# Patient Record
Sex: Female | Born: 1960 | Race: Black or African American | Hispanic: No | Marital: Married | Smoking: Former smoker
Health system: Southern US, Community
[De-identification: ages and names within clinical notes are randomized; demographics above are authoritative.]

## PROBLEM LIST (undated history)

## (undated) DIAGNOSIS — Z889 Allergy status to unspecified drugs, medicaments and biological substances status: Secondary | ICD-10-CM

## (undated) DIAGNOSIS — E119 Type 2 diabetes mellitus without complications: Secondary | ICD-10-CM

## (undated) DIAGNOSIS — I10 Essential (primary) hypertension: Secondary | ICD-10-CM

## (undated) DIAGNOSIS — E7800 Pure hypercholesterolemia, unspecified: Secondary | ICD-10-CM

## (undated) HISTORY — PX: TUBAL LIGATION: SHX77

## (undated) HISTORY — PX: OTHER SURGICAL HISTORY: SHX169

## (undated) HISTORY — PX: DILATION AND CURETTAGE OF UTERUS: SHX78

---

## 2000-07-04 ENCOUNTER — Encounter: Payer: Self-pay | Admitting: Emergency Medicine

## 2000-07-04 ENCOUNTER — Emergency Department (HOSPITAL_COMMUNITY): Admission: EM | Admit: 2000-07-04 | Discharge: 2000-07-04 | Payer: Self-pay | Admitting: *Deleted

## 2000-09-05 ENCOUNTER — Emergency Department (HOSPITAL_COMMUNITY): Admission: EM | Admit: 2000-09-05 | Discharge: 2000-09-05 | Payer: Self-pay | Admitting: Emergency Medicine

## 2000-09-10 ENCOUNTER — Ambulatory Visit (HOSPITAL_COMMUNITY): Admission: RE | Admit: 2000-09-10 | Discharge: 2000-09-10 | Payer: Self-pay | Admitting: Internal Medicine

## 2000-09-11 ENCOUNTER — Encounter: Payer: Self-pay | Admitting: Internal Medicine

## 2000-10-21 ENCOUNTER — Ambulatory Visit (HOSPITAL_COMMUNITY): Admission: RE | Admit: 2000-10-21 | Discharge: 2000-10-21 | Payer: Self-pay | Admitting: Internal Medicine

## 2000-10-21 ENCOUNTER — Encounter: Payer: Self-pay | Admitting: Internal Medicine

## 2000-12-18 ENCOUNTER — Ambulatory Visit (HOSPITAL_COMMUNITY): Admission: RE | Admit: 2000-12-18 | Discharge: 2000-12-18 | Payer: Self-pay | Admitting: Internal Medicine

## 2000-12-18 ENCOUNTER — Encounter: Payer: Self-pay | Admitting: Internal Medicine

## 2001-01-04 ENCOUNTER — Emergency Department (HOSPITAL_COMMUNITY): Admission: EM | Admit: 2001-01-04 | Discharge: 2001-01-04 | Payer: Self-pay | Admitting: Emergency Medicine

## 2001-03-11 ENCOUNTER — Ambulatory Visit (HOSPITAL_COMMUNITY): Admission: RE | Admit: 2001-03-11 | Discharge: 2001-03-11 | Payer: Self-pay | Admitting: Internal Medicine

## 2001-03-12 ENCOUNTER — Encounter: Payer: Self-pay | Admitting: Internal Medicine

## 2001-05-28 ENCOUNTER — Emergency Department (HOSPITAL_COMMUNITY): Admission: EM | Admit: 2001-05-28 | Discharge: 2001-05-28 | Payer: Self-pay | Admitting: *Deleted

## 2001-06-28 ENCOUNTER — Encounter: Payer: Self-pay | Admitting: Otolaryngology

## 2001-06-28 ENCOUNTER — Ambulatory Visit (HOSPITAL_COMMUNITY): Admission: RE | Admit: 2001-06-28 | Discharge: 2001-06-28 | Payer: Self-pay | Admitting: Otolaryngology

## 2002-04-13 ENCOUNTER — Encounter: Payer: Self-pay | Admitting: Internal Medicine

## 2002-04-13 ENCOUNTER — Ambulatory Visit (HOSPITAL_COMMUNITY): Admission: RE | Admit: 2002-04-13 | Discharge: 2002-04-13 | Payer: Self-pay | Admitting: Internal Medicine

## 2002-09-01 ENCOUNTER — Ambulatory Visit (HOSPITAL_COMMUNITY): Admission: RE | Admit: 2002-09-01 | Discharge: 2002-09-01 | Payer: Self-pay | Admitting: Internal Medicine

## 2002-09-01 ENCOUNTER — Encounter: Payer: Self-pay | Admitting: Internal Medicine

## 2002-09-23 ENCOUNTER — Ambulatory Visit (HOSPITAL_COMMUNITY): Admission: RE | Admit: 2002-09-23 | Discharge: 2002-09-23 | Payer: Self-pay | Admitting: Internal Medicine

## 2002-11-25 ENCOUNTER — Emergency Department (HOSPITAL_COMMUNITY): Admission: EM | Admit: 2002-11-25 | Discharge: 2002-11-25 | Payer: Self-pay | Admitting: Emergency Medicine

## 2002-11-28 ENCOUNTER — Encounter: Payer: Self-pay | Admitting: Internal Medicine

## 2002-11-28 ENCOUNTER — Ambulatory Visit (HOSPITAL_COMMUNITY): Admission: RE | Admit: 2002-11-28 | Discharge: 2002-11-28 | Payer: Self-pay | Admitting: Internal Medicine

## 2002-11-29 ENCOUNTER — Observation Stay (HOSPITAL_COMMUNITY): Admission: AD | Admit: 2002-11-29 | Discharge: 2002-11-30 | Payer: Self-pay | Admitting: Internal Medicine

## 2003-04-20 ENCOUNTER — Ambulatory Visit (HOSPITAL_COMMUNITY): Admission: RE | Admit: 2003-04-20 | Discharge: 2003-04-20 | Payer: Self-pay | Admitting: Internal Medicine

## 2003-09-19 ENCOUNTER — Ambulatory Visit (HOSPITAL_COMMUNITY): Admission: RE | Admit: 2003-09-19 | Discharge: 2003-09-19 | Payer: Self-pay | Admitting: Internal Medicine

## 2003-09-26 ENCOUNTER — Ambulatory Visit (HOSPITAL_COMMUNITY): Admission: RE | Admit: 2003-09-26 | Discharge: 2003-09-26 | Payer: Self-pay | Admitting: Internal Medicine

## 2004-02-27 ENCOUNTER — Ambulatory Visit (HOSPITAL_COMMUNITY): Admission: RE | Admit: 2004-02-27 | Discharge: 2004-02-27 | Payer: Self-pay | Admitting: Internal Medicine

## 2004-05-13 ENCOUNTER — Ambulatory Visit (HOSPITAL_COMMUNITY): Admission: RE | Admit: 2004-05-13 | Discharge: 2004-05-13 | Payer: Self-pay | Admitting: Internal Medicine

## 2005-06-09 ENCOUNTER — Ambulatory Visit (HOSPITAL_COMMUNITY): Admission: RE | Admit: 2005-06-09 | Discharge: 2005-06-09 | Payer: Self-pay | Admitting: Internal Medicine

## 2006-06-01 ENCOUNTER — Ambulatory Visit (HOSPITAL_COMMUNITY): Admission: RE | Admit: 2006-06-01 | Discharge: 2006-06-01 | Payer: Self-pay | Admitting: Internal Medicine

## 2007-06-07 ENCOUNTER — Encounter (INDEPENDENT_AMBULATORY_CARE_PROVIDER_SITE_OTHER): Payer: Self-pay | Admitting: Internal Medicine

## 2007-06-07 ENCOUNTER — Ambulatory Visit: Payer: Self-pay | Admitting: Cardiology

## 2007-06-07 ENCOUNTER — Ambulatory Visit (HOSPITAL_COMMUNITY): Admission: RE | Admit: 2007-06-07 | Discharge: 2007-06-07 | Payer: Self-pay | Admitting: Internal Medicine

## 2007-06-08 ENCOUNTER — Ambulatory Visit (HOSPITAL_COMMUNITY): Admission: RE | Admit: 2007-06-08 | Discharge: 2007-06-08 | Payer: Self-pay | Admitting: Internal Medicine

## 2007-07-05 ENCOUNTER — Ambulatory Visit (HOSPITAL_COMMUNITY): Admission: RE | Admit: 2007-07-05 | Discharge: 2007-07-05 | Payer: Self-pay | Admitting: Internal Medicine

## 2008-06-01 ENCOUNTER — Other Ambulatory Visit: Admission: RE | Admit: 2008-06-01 | Discharge: 2008-06-01 | Payer: Self-pay | Admitting: Obstetrics and Gynecology

## 2008-07-06 ENCOUNTER — Ambulatory Visit (HOSPITAL_COMMUNITY): Admission: RE | Admit: 2008-07-06 | Discharge: 2008-07-06 | Payer: Self-pay | Admitting: Obstetrics & Gynecology

## 2008-07-18 ENCOUNTER — Ambulatory Visit (HOSPITAL_COMMUNITY): Admission: RE | Admit: 2008-07-18 | Discharge: 2008-07-18 | Payer: Self-pay | Admitting: Internal Medicine

## 2009-06-25 ENCOUNTER — Other Ambulatory Visit: Admission: RE | Admit: 2009-06-25 | Discharge: 2009-06-25 | Payer: Self-pay | Admitting: Obstetrics & Gynecology

## 2009-07-19 ENCOUNTER — Ambulatory Visit (HOSPITAL_COMMUNITY): Admission: RE | Admit: 2009-07-19 | Discharge: 2009-07-19 | Payer: Self-pay | Admitting: Obstetrics & Gynecology

## 2010-03-18 ENCOUNTER — Encounter: Payer: Self-pay | Admitting: Internal Medicine

## 2010-06-04 LAB — COMPREHENSIVE METABOLIC PANEL
ALT: 16 U/L (ref 0–35)
AST: 17 U/L (ref 0–37)
Alkaline Phosphatase: 43 U/L (ref 39–117)
CO2: 24 mEq/L (ref 19–32)
Calcium: 9.4 mg/dL (ref 8.4–10.5)
GFR calc Af Amer: >60 mL/min (ref >60)
Glucose, Bld: 99 mg/dL (ref 70–99)
Potassium: 4.2 mEq/L (ref 3.5–5.1)
Sodium: 138 mEq/L (ref 135–145)
Total Protein: 6.9 g/dL (ref 6.0–8.3)

## 2010-06-04 LAB — URINALYSIS, MICROSCOPIC ONLY
Bilirubin Urine: NEGATIVE
Hgb urine dipstick: NEGATIVE
Ketones, ur: NEGATIVE mg/dL
Specific Gravity, Urine: >1.03 — ABNORMAL HIGH (ref 1.005–1.030)
pH: 6 (ref 5.0–8.0)

## 2010-06-04 LAB — CBC
Hemoglobin: 13 g/dL (ref 12.0–15.0)
MCHC: 34.5 g/dL (ref 30.0–36.0)
RBC: 4.15 MIL/uL (ref 3.87–5.11)

## 2010-06-13 ENCOUNTER — Other Ambulatory Visit: Payer: Self-pay | Admitting: Obstetrics & Gynecology

## 2010-06-13 DIAGNOSIS — Z139 Encounter for screening, unspecified: Secondary | ICD-10-CM

## 2010-07-04 ENCOUNTER — Other Ambulatory Visit (HOSPITAL_COMMUNITY)
Admission: RE | Admit: 2010-07-04 | Discharge: 2010-07-04 | Disposition: A | Discharge status: Home / Self Care | Payer: BC Managed Care – PPO | Source: Ambulatory Visit | Attending: Obstetrics & Gynecology | Admitting: Obstetrics & Gynecology

## 2010-07-04 DIAGNOSIS — Z01419 Encounter for gynecological examination (general) (routine) without abnormal findings: Secondary | ICD-10-CM | POA: Insufficient documentation

## 2010-07-09 NOTE — Op Note (Signed)
[name], [name]                  ACCOUNT NO.:  [id number]   MEDICAL RECORD NO.:  [id number]          PATIENT TYPE:  AMB   LOCATION:  DAY                           FACILITY:  [facility]   PHYSICIAN:  Lazaro Arms, M.D.   DATE OF BIRTH:  Nov 04, 1960   DATE OF PROCEDURE:  07/06/2008  DATE OF DISCHARGE:                               OPERATIVE REPORT   PREOPERATIVE DIAGNOSES:  1. Menometrorrhagia.  2. Dysmenorrhea.   POSTOPERATIVE DIAGNOSES:  1. Menometrorrhagia.  2. Dysmenorrhea.   PROCEDURE:  Diagnostic hysteroscopy with endometrial ablation.   SURGEON:  Lazaro Arms, M.D.   ANESTHESIA:  Laryngeal mask airway.   FINDINGS:  The patient had normal endometrium, no fibroids, no polyps,  no abnormalities.   DESCRIPTION OF OPERATION:  The patient was taken to the operating room,  placed in supine position, where she underwent laryngeal mask airway.  Placed in dorsal supine position, prepped and draped in the usual  sterile fashion.  Cervix was grasped, dilated serially to allow passage  of the hysteroscope.  Diagnostic hysteroscopy was performed and found to  be normal.  There were no abnormalities.  She had very thin endometrium  and difficult angle of her cervix, so I skipped the curettage and went  and did the endometrial ablation using ThermaChoice III balloon, 20 mL  D5W was required to maintain a pressure between 190-200 mmHg.  She  tolerated the procedure well.  Total therapy time was 2 minutes 25  seconds.  All the fluid was returned at the end of the procedure.  She  was awakened from anesthesia, taken to recovery room in good stable  condition.  All counts were correct x3.  She received Ancef and Toradol  prophylactically.      Lazaro Arms, M.D.  Electronically Signed     LHE/MEDQ  D:  07/06/2008  T:  07/07/2008  Job:  [id number]

## 2010-07-12 NOTE — H&P (Signed)
   NAME:  Cassandra Ballard, Cassandra Ballard                         ACCOUNT NO.:  [id number]   MEDICAL RECORD NO.:  [id number]                   PATIENT TYPE:  AMB   LOCATION:  DAY                                  FACILITY:  [facility]   PHYSICIAN:  Bernerd Limbo. Leona Carry, M.D.             DATE OF BIRTH:  05/25/1960   DATE OF ADMISSION:  09/23/2002  DATE OF DISCHARGE:                                HISTORY & PHYSICAL   REASON FOR ADMISSION:  This 50 year old black female was admitted to this  hospital with irregular vaginal bleeding.   HISTORY OF PRESENT ILLNESS:  She is a gravida 2, para 2, AB 0.  She has had  irregular bleeding since early in 2000.  She has been unresponsive to  conservative treatment reveals an increase in the amount of endometrial  tissue.  This was confirmed by pelvic ultrasound.  The recent pregnancy test  was negative.   PAST HISTORY:  As mentioned, gravida 2, para 2, AB 0.  She has had upper  endoscopy and lower endoscopy by Dr. Kendell Bane.  She has been treated in the  past for GERD.   PHYSICAL EXAMINATION:  GENERAL APPEARANCE:  A healthy, 50 year old, black  female in no acute distress.  VITAL SIGNS:  Blood pressure 110/74, pulse 80, respirations 20.  HEENT:  Normal.  No jaundice.  NECK:  Supple.  Thyroid not enlarged.  CARDIOVASCULAR:  Regular sinus rhythm with no thrills or murmurs.  RESPIRATORY:  Chest clear to auscultation and percussion.  BREASTS:  Moderate size.  Nipples symmetrical.  No masses.  Examination of  both of the axillae is normal.  ABDOMEN:  Soft.  No areas of tenderness.  No visceromegaly.  No operative  scars.  LIMBS:  Negative.  BACK:  Negative.  PELVIC:  There is a marital introitus.  No pathology of Bartholin's or  Skene's glands.  Cervix in mid position and it is clean.  There is some old  blood in the vault.  The uterus seemed to be a little bit larger than  normal.  Examination of both adnexa was normal.  A recent Pap smear was  within normal limits.   ADMISSION DIAGNOSIS:  Dysfunctional uterine bleeding.   DISPOSITION:  The patient is admitted for a D&C.  The surgery, risks,  complications, and consequences have been discussed with the patient and she  agrees.  She has been scheduled for the [facility] on September 23, 2002.                                               Bernerd Limbo. Leona Carry, M.D.    NMD/MEDQ  D:  09/22/2002  T:  09/22/2002  Job:  [id number]

## 2010-07-12 NOTE — Op Note (Signed)
   NAME:  Cassandra Ballard, Cassandra Ballard                         ACCOUNT NO.:  [id number]   MEDICAL RECORD NO.:  [id number]                   PATIENT TYPE:  AMB   LOCATION:  DAY                                  FACILITY:  [facility]   PHYSICIAN:  Bernerd Limbo. Leona Carry, M.D.             DATE OF BIRTH:  [birth year]   DATE OF PROCEDURE:  09/23/2002  DATE OF DISCHARGE:                                 OPERATIVE REPORT   PREOPERATIVE DIAGNOSIS:  Endometrial hyperplasia.   POSTOPERATIVE DIAGNOSIS:  Endometrial hyperplasia.   PROCEDURE:  D&C.   COMPLICATIONS:  None.   SURGEON:  Bernerd Limbo. Destefano, M.D.   DESCRIPTION OF PROCEDURE:  Under adequate general anesthesia, the patient  was prepped and draped in the usual manner.   Bimanual examination revealed the uterus to be slightly larger than normal.  Examination of both adnexa was normal.  A metal speculum was inserted into  the vagina.  The cervix appeared to be clean, but there was old blood oozing  from it.  The uterine sound was passed to a depth of about 6 inches.  It  seemed to be a little bit larger than I could tell with bimanual  examination.  The cervical canal was then dilated with a Goodell dilator.  Utilizing several different sizes of curettes, though curetting of the  uterine cavity was carried out, obtaining a large amount of endometrial  tissue.  I really did not see any polyps.  At the completion of the  procedure, all bleeding was under control.  I left a gauze sponge in the  vault to be removed in the recovery room.  Blood loss was minimal.   The patient tolerated the procedure nicely and left the room in good  condition.                                               Bernerd Limbo. Leona Carry, M.D.    NMD/MEDQ  D:  09/23/2002  T:  09/23/2002  Job:  [id number]

## 2010-07-12 NOTE — H&P (Signed)
NAME:  Cassandra Ballard, Cassandra Ballard                         ACCOUNT NO.:  [id number]   MEDICAL RECORD NO.:  [id number]                   PATIENT TYPE:  INP   LOCATION:  A321                                 FACILITY:  [facility]   PHYSICIAN:  Tesfaye D. Felecia Shelling, M.D.              DATE OF BIRTH:  1960-11-21   DATE OF ADMISSION:  11/29/2002  DATE OF DISCHARGE:                                HISTORY & PHYSICAL   CHIEF COMPLAINT:  Abnormal lab findings.   HISTORY OF PRESENT ILLNESS:  This is a 50 year old, African-American female  with history of anxiety disorder and gastroesophageal reflux disease who was  found to have a very low hemoglobin and hematocrit.  She was seen in the  office about two days back with multiple complaints including weakness and  dizziness.  Her blood tests showed a hemoglobin of 7 and hematocrit of 22.  The patient had history of prolonged vaginal bleeding for about one month  for which she was seen by gynecologist and underwent dilatation and  curettage.  At this time, her vaginal bleeding has stopped and GYN symptoms  have subsided.  She had no rectal bleed, hematemesis or melena.  The patient  has no history of hematologic problems.  Her previous lab tests show a  normal hemoglobin and hematocrit.  The patient was reevaluated in the office  and rectal examination of her stool was guaiac negative.  She was admitted  for blood transfusion and further evaluation.   REVIEW OF SYMPTOMS:  CONSTITUTIONAL:  The patient complains of generalized  weakness, dizziness and pain in her right hip.  CARDIOPULMONARY:  No cough,  fever, chest pain or shortness of breath.  GASTROINTESTINAL:  The patient  has some epigastric discomfort.  No history of nausea, vomiting, melena or  hematemesis.  GENITOURINARY:  No dysuria, urgency or frequency with  urination.   PAST MEDICAL HISTORY:  1. Gastroesophageal reflux disease.  2. History of anxiety disorder.  3. Uterine bleeding.   SOCIAL HISTORY:   The patient is employed full time.  She stopped smoking  about two years ago.  No history of alcohol or substance abuse.   PHYSICAL EXAMINATION:  GENERAL:  The patient is alert, awake and sick  looking.  VITAL SIGNS:  Blood pressure 170/80, pulse 88, respirations 16, temperature  98 degrees Fahrenheit.  HEENT:  Pupils equal round and reactive to light.  NECK:  Supple.  CHEST:  Clear with good air entry.  CARDIAC:  S1, S2 heart sounds heard.  No murmur or gallop.  ABDOMEN:  Soft, normoactive bowel sounds.  No mass or organomegaly.  EXTREMITIES:  No leg edema.   ASSESSMENT:  1. Severe microcytic anemia probably secondary to chronic blood loss due to     dysfunctional uterine bleeding.  2. History of gastroesophageal reflux disease.  3. Anxiety disorder.   PLAN:  1. Will type, cross match and transfuse three units of  packed red blood     cells.  2. Will do anemia profile.  3. On discharge, will start the patient on iron therapy and continue to     monitor her white count.     ___________________________________________                                         Eustaquio Maize. Felecia Shelling, M.D.   TDF/MEDQ  D:  11/30/2002  T:  11/30/2002  Job:  [id number]

## 2010-07-23 ENCOUNTER — Ambulatory Visit (HOSPITAL_COMMUNITY): Payer: BC Managed Care – PPO

## 2010-07-25 ENCOUNTER — Ambulatory Visit (HOSPITAL_COMMUNITY)
Admission: RE | Admit: 2010-07-25 | Discharge: 2010-07-25 | Disposition: A | Discharge status: Home / Self Care | Payer: BC Managed Care – PPO | Source: Ambulatory Visit | Attending: Obstetrics & Gynecology | Admitting: Obstetrics & Gynecology

## 2010-07-25 DIAGNOSIS — Z139 Encounter for screening, unspecified: Secondary | ICD-10-CM

## 2010-07-25 DIAGNOSIS — Z1231 Encounter for screening mammogram for malignant neoplasm of breast: Secondary | ICD-10-CM | POA: Insufficient documentation

## 2010-08-01 ENCOUNTER — Other Ambulatory Visit: Payer: Self-pay | Admitting: Obstetrics & Gynecology

## 2010-08-01 DIAGNOSIS — R928 Other abnormal and inconclusive findings on diagnostic imaging of breast: Secondary | ICD-10-CM

## 2010-08-14 ENCOUNTER — Ambulatory Visit (HOSPITAL_COMMUNITY)
Admission: RE | Admit: 2010-08-14 | Discharge: 2010-08-14 | Disposition: A | Discharge status: Home / Self Care | Payer: BC Managed Care – PPO | Source: Ambulatory Visit | Attending: Obstetrics & Gynecology | Admitting: Obstetrics & Gynecology

## 2010-08-14 ENCOUNTER — Other Ambulatory Visit: Payer: Self-pay | Admitting: Obstetrics & Gynecology

## 2010-08-14 ENCOUNTER — Other Ambulatory Visit (HOSPITAL_COMMUNITY): Payer: Self-pay | Admitting: Obstetrics & Gynecology

## 2010-08-14 DIAGNOSIS — R928 Other abnormal and inconclusive findings on diagnostic imaging of breast: Secondary | ICD-10-CM

## 2010-11-19 LAB — BLOOD GAS, ARTERIAL
Bicarbonate: 23.6
FIO2: 0.21
TCO2: 21.1
pCO2 arterial: 37.4
pH, Arterial: 7.415 — ABNORMAL HIGH
pO2, Arterial: 88.1

## 2011-01-09 ENCOUNTER — Other Ambulatory Visit: Payer: Self-pay

## 2011-01-09 ENCOUNTER — Telehealth: Payer: Self-pay

## 2011-01-09 DIAGNOSIS — Z139 Encounter for screening, unspecified: Secondary | ICD-10-CM

## 2011-01-09 NOTE — Telephone Encounter (Signed)
Gastroenterology Pre-Procedure Form     Request Date: 01/15/2011         Requesting Physician: Dr. Felecia Shelling     PATIENT INFORMATION:  Cassandra Ballard is a 50 y.o., female (DOB=05-15-1960).  PROCEDURE: Procedure(s) requested: colonoscopy Procedure Reason: screening for colon cancer  PATIENT REVIEW QUESTIONS: The patient reports the following:   1. Diabetes Melitis: no    BORLINE/ WATCHING DIET 2. Joint replacements in the past 12 months: no 3. Major health problems in the past 3 months: no 4. Has an artificial valve or MVP:no 5. Has been advised in past to take antibiotics in advance of a procedure like teeth cleaning: no}    MEDICATIONS & ALLERGIES:    Patient reports the following regarding taking any blood thinners:   Plavix? no Aspirin?no Coumadin?  no  Patient confirms/reports the following medications:  Current Outpatient Prescriptions  Medication Sig Dispense Refill  . acetaminophen (TYLENOL) 325 MG tablet Take 650 mg by mouth every 6 (six) hours as needed. When needed but seldom       . amLODipine (NORVASC) 5 MG tablet Take 5 mg by mouth daily.        . Multiple Vitamin (MULTIVITAMIN) tablet Take 1 tablet by mouth daily.          Patient confirms/reports the following allergies:  No Known Allergies  Patient is appropriate to schedule for requested procedure(s): yes  AUTHORIZATION INFORMATION Primary Insurance:  ID #   Group # Pre-Cert / Auth required: Pre-Cert / Auth #  Secondary Insurance  ID #  Group # Pre-Cert / Auth required Pre-Cert / Auth #   No orders of the defined types were placed in this encounter.    SCHEDULE INFORMATION: Procedure has been scheduled as follows:  Date: 02/05/2011        Time: 10:45 AM  Location: [facility] Short Stay  This Gastroenterology Pre-Precedure Form is being routed to the following provider(s) for review: R. Roetta Sessions, MD   Rx and instructions mailed.

## 2011-01-15 NOTE — Telephone Encounter (Signed)
OK to proceed with colonoscopy.

## 2011-01-29 ENCOUNTER — Encounter (HOSPITAL_COMMUNITY): Payer: Self-pay | Admitting: Pharmacy Technician

## 2011-02-04 MED ORDER — SODIUM CHLORIDE 0.45 % IV SOLN
Freq: Once | INTRAVENOUS | Status: AC
  Administered 2011-02-05: 10:00:00 via INTRAVENOUS

## 2011-02-05 ENCOUNTER — Encounter (HOSPITAL_COMMUNITY): Payer: Self-pay | Admitting: *Deleted

## 2011-02-05 ENCOUNTER — Other Ambulatory Visit: Payer: Self-pay | Admitting: Internal Medicine

## 2011-02-05 ENCOUNTER — Encounter (HOSPITAL_COMMUNITY)
Admission: RE | Disposition: A | Discharge status: Home / Self Care | Payer: Self-pay | Source: Ambulatory Visit | Attending: Internal Medicine

## 2011-02-05 ENCOUNTER — Ambulatory Visit (HOSPITAL_COMMUNITY)
Admission: RE | Admit: 2011-02-05 | Discharge: 2011-02-05 | Disposition: A | Discharge status: Home / Self Care | Payer: BC Managed Care – PPO | Source: Ambulatory Visit | Attending: Internal Medicine | Admitting: Internal Medicine

## 2011-02-05 DIAGNOSIS — Z139 Encounter for screening, unspecified: Secondary | ICD-10-CM

## 2011-02-05 DIAGNOSIS — K621 Rectal polyp: Secondary | ICD-10-CM

## 2011-02-05 DIAGNOSIS — Z1211 Encounter for screening for malignant neoplasm of colon: Secondary | ICD-10-CM | POA: Insufficient documentation

## 2011-02-05 DIAGNOSIS — D128 Benign neoplasm of rectum: Secondary | ICD-10-CM | POA: Insufficient documentation

## 2011-02-05 DIAGNOSIS — D126 Benign neoplasm of colon, unspecified: Secondary | ICD-10-CM | POA: Insufficient documentation

## 2011-02-05 DIAGNOSIS — K620 Anal polyp: Secondary | ICD-10-CM

## 2011-02-05 DIAGNOSIS — D129 Benign neoplasm of anus and anal canal: Secondary | ICD-10-CM | POA: Insufficient documentation

## 2011-02-05 HISTORY — DX: Essential (primary) hypertension: I10

## 2011-02-05 HISTORY — DX: Pure hypercholesterolemia, unspecified: E78.00

## 2011-02-05 HISTORY — PX: COLONOSCOPY: SHX5424

## 2011-02-05 HISTORY — DX: Allergy status to unspecified drugs, medicaments and biological substances: Z88.9

## 2011-02-05 SURGERY — COLONOSCOPY
Anesthesia: Moderate Sedation

## 2011-02-05 MED ORDER — STERILE WATER FOR IRRIGATION IR SOLN
Status: DC | PRN
  Administered 2011-02-05: 11:00:00

## 2011-02-05 MED ORDER — MIDAZOLAM HCL 5 MG/5ML IJ SOLN
INTRAMUSCULAR | Status: DC | PRN
  Administered 2011-02-05: 2 mg via INTRAVENOUS
  Administered 2011-02-05: 1 mg via INTRAVENOUS

## 2011-02-05 MED ORDER — MEPERIDINE HCL 100 MG/ML IJ SOLN
INTRAMUSCULAR | Status: AC
  Filled 2011-02-05: qty 2

## 2011-02-05 MED ORDER — MIDAZOLAM HCL 5 MG/5ML IJ SOLN
INTRAMUSCULAR | Status: AC
  Filled 2011-02-05: qty 10

## 2011-02-05 MED ORDER — MEPERIDINE HCL 100 MG/ML IJ SOLN
INTRAMUSCULAR | Status: DC | PRN
  Administered 2011-02-05: 50 mg via INTRAVENOUS
  Administered 2011-02-05: 25 mg via INTRAVENOUS

## 2011-02-05 NOTE — Progress Notes (Signed)
Patient ID: Cassandra Ballard, female   DOB: Aug 18, 1960, 50 y.o.   MRN: [id number] Primary Care Physician:  Avon Gully, MD Primary Gastroenterologist:  Dr.   Pre-Procedure History & Physical: HPI:  Cassandra Ballard is a 50 y.o. female is here for a screening colonoscopy.   First ever screening examination. No GI symptoms. No family history polyps or colon cancer.  Past Medical History  Diagnosis Date  . Hypertension   . History of seasonal allergies   . Hypercholesteremia     Past Surgical History  Procedure Date  . Tubal ligation   . Right hand sugery   . Dilation and curettage of uterus     Prior to Admission medications   Medication Sig Start Date End Date Taking? Authorizing Provider  amLODipine (NORVASC) 5 MG tablet Take 5 mg by mouth daily.     Yes Historical Provider, MD  fluticasone (FLONASE) 50 MCG/ACT nasal spray Place 2 sprays into the nose daily as needed. For allergies    Yes Historical Provider, MD  acetaminophen (TYLENOL) 325 MG tablet Take 650 mg by mouth every 6 (six) hours as needed. For pain When needed but seldom    Historical Provider, MD  loratadine (CLARITIN) 10 MG tablet Take 10 mg by mouth daily as needed. For allergies     Historical Provider, MD  Multiple Vitamins-Minerals (MULTIVITAMINS THER. W/MINERALS) TABS Take 1 tablet by mouth daily.      Historical Provider, MD    Allergies as of 01/09/2011  . (No Known Allergies)    Family History  Problem Relation Age of Onset  . Colon cancer Neg Hx     History   Social History  . Marital Status: Married    Spouse Name: N/A    Number of Children: N/A  . Years of Education: N/A   Occupational History  . Not on file.   Social History Main Topics  . Smoking status: Former Smoker -- 1.0 packs/day for 23 years  . Smokeless tobacco: Not on file  . Alcohol Use: No  . Drug Use: No  . Sexually Active:    Other Topics Concern  . Not on file   Social History Narrative  . No narrative on file    Review  of Systems: See HPI, otherwise negative ROS  Physical Exam: BP 138/64  Pulse 84  Temp(Src) 98.9 F (37.2 C) (Oral)  Resp 15  Ht 5\' 5"  (1.651 m)  Wt 220 lb (99.791 kg)  BMI 36.61 kg/m2  SpO2 97% General:   Alert,  Well-developed, well-nourished, pleasant and cooperative in NAD Head:  Normocephalic and atraumatic. Eyes:  Sclera clear, no icterus.   Conjunctiva pink. Ears:  Normal auditory acuity. Nose:  No deformity, discharge,  or lesions. Mouth:  No deformity or lesions, dentition normal. Neck:  Supple; no masses or thyromegaly. Lungs:  Clear throughout to auscultation.   No wheezes, crackles, or rhonchi. No acute distress. Heart:  Regular rate and rhythm; no murmurs, clicks, rubs,  or gallops. Abdomen:  Soft, nontender and nondistended. No masses, hepatosplenomegaly or hernias noted. Normal bowel sounds, without guarding, and without rebound.   Msk:  Symmetrical without gross deformities. Normal posture. Pulses:  Normal pulses noted. Extremities:  Without clubbing or edema. Neurologic:  Alert and  oriented x4;  grossly normal neurologically. Skin:  Intact without significant lesions or rashes. Psych:  Alert and cooperative. Normal mood and affect.  Impression/Plan: Cassandra Ballard is now here to undergo a screening colonoscopy.  Average risk,  first ever examination.  The risks, benefits, limitations, imponderables and alternatives regarding colonoscopy have been reviewed with the patient. Questions have been answered. All parties agreeable.

## 2011-02-05 NOTE — Op Note (Signed)
[facility] [address] [place], [place]  [address]  COLONOSCOPY PROCEDURE REPORT  PATIENT:  Cassandra, Ballard  MR#:  [id number] BIRTHDATE:  [birth year], 50 yrs. old  GENDER:  female ENDOSCOPIST:  R. Roetta Sessions, MD FACP [facility] REF. BY:           Dr. Felecia Shelling PROCEDURE DATE:  02/05/2011 PROCEDURE:   Colonoscopy with snare polypectomy and polyp ablation  INDICATIONS:   First ever average risk screening colonoscopy  INFORMED CONSENT:  The risks, benefits, alternatives and imponderables including but not limited to bleeding, perforation as well as the possibility of a missed lesion have been reviewed. The potential for biopsy, lesion removal, etc. have also been discussed.  Questions have been answered.  All parties agreeable. Please see the history and physical in the medical record for more information.  MEDICATIONS:   Versed 3 mg IV and Demerol 75 mg IV in divided doses.  DESCRIPTION OF PROCEDURE:  After a digital rectal exam was performed, the EC-[id number] ([id number]) colonoscope was advanced from the anus through the rectum and colon to the area of the cecum, ileocecal valve and appendiceal orifice.  The cecum was deeply intubated.  These structures were well-seen and photographed for the record.  From the level of the cecum and ileocecal valve, the scope was slowly and cautiously withdrawn.  The mucosal surfaces were carefully surveyed utilizing scope tip deflection to facilitate fold flattening as needed.  The scope was pulled down into the rectum where a thorough examination including retroflexion was performed. <<PROCEDUREIMAGES>>  FINDINGS: Adequate preparation. 4 mm polyp in the descending segment ; otherwise normal colon. multiple diminutive hyperplastic appearing          rectal polyps.  THERAPEUTIC / DIAGNOSTIC MANEUVERS PERFORMED: The asecnding colon polyp was removed via cold snare technique. The diminutive rectal polyps were ablated with the tip of a hot snare  cautery loop  COMPLICATIONS:   none  CECAL WITHDRAWAL TIME: 11 minutes  IMPRESSION:       Colonic polyps treated as described above  RECOMMENDATIONS: Followup on pathology report  ______________________________ R. Roetta Sessions, MD Caleen Essex  CC:  n. eSIGNED:   R. Michael Jordis Repetto at 02/05/2011 11:21 AM  Iva Lento, [phone]

## 2011-02-20 ENCOUNTER — Encounter (HOSPITAL_COMMUNITY): Payer: Self-pay | Admitting: Internal Medicine

## 2011-02-24 ENCOUNTER — Other Ambulatory Visit (HOSPITAL_COMMUNITY): Payer: Self-pay | Admitting: Internal Medicine

## 2011-02-24 DIAGNOSIS — Z09 Encounter for follow-up examination after completed treatment for conditions other than malignant neoplasm: Secondary | ICD-10-CM

## 2011-03-12 ENCOUNTER — Ambulatory Visit (HOSPITAL_COMMUNITY)
Admission: RE | Admit: 2011-03-12 | Discharge: 2011-03-12 | Disposition: A | Discharge status: Home / Self Care | Payer: Self-pay | Source: Ambulatory Visit | Attending: Internal Medicine | Admitting: Internal Medicine

## 2011-03-12 DIAGNOSIS — Z09 Encounter for follow-up examination after completed treatment for conditions other than malignant neoplasm: Secondary | ICD-10-CM

## 2011-03-12 DIAGNOSIS — R928 Other abnormal and inconclusive findings on diagnostic imaging of breast: Secondary | ICD-10-CM | POA: Insufficient documentation

## 2011-06-11 ENCOUNTER — Other Ambulatory Visit: Payer: Self-pay | Admitting: Obstetrics & Gynecology

## 2011-06-11 DIAGNOSIS — Z09 Encounter for follow-up examination after completed treatment for conditions other than malignant neoplasm: Secondary | ICD-10-CM

## 2011-06-11 DIAGNOSIS — Z139 Encounter for screening, unspecified: Secondary | ICD-10-CM

## 2011-07-23 ENCOUNTER — Inpatient Hospital Stay (HOSPITAL_COMMUNITY): Admission: RE | Admit: 2011-07-23 | Payer: BC Managed Care – PPO | Source: Ambulatory Visit

## 2011-07-29 ENCOUNTER — Other Ambulatory Visit: Payer: Self-pay | Admitting: Obstetrics & Gynecology

## 2011-07-29 ENCOUNTER — Other Ambulatory Visit (HOSPITAL_COMMUNITY)
Admission: RE | Admit: 2011-07-29 | Discharge: 2011-07-29 | Disposition: A | Discharge status: Home / Self Care | Payer: BC Managed Care – PPO | Source: Ambulatory Visit | Attending: Obstetrics & Gynecology | Admitting: Obstetrics & Gynecology

## 2011-07-29 DIAGNOSIS — Z01419 Encounter for gynecological examination (general) (routine) without abnormal findings: Secondary | ICD-10-CM | POA: Insufficient documentation

## 2011-07-30 ENCOUNTER — Ambulatory Visit (HOSPITAL_COMMUNITY)
Admission: RE | Admit: 2011-07-30 | Discharge: 2011-07-30 | Disposition: A | Discharge status: Home / Self Care | Payer: BC Managed Care – PPO | Source: Ambulatory Visit | Attending: Obstetrics & Gynecology | Admitting: Obstetrics & Gynecology

## 2011-07-30 DIAGNOSIS — Z09 Encounter for follow-up examination after completed treatment for conditions other than malignant neoplasm: Secondary | ICD-10-CM | POA: Insufficient documentation

## 2011-07-30 DIAGNOSIS — N6489 Other specified disorders of breast: Secondary | ICD-10-CM | POA: Insufficient documentation

## 2012-04-22 ENCOUNTER — Encounter (HOSPITAL_COMMUNITY): Payer: Self-pay | Admitting: *Deleted

## 2012-04-22 ENCOUNTER — Emergency Department (HOSPITAL_COMMUNITY): Payer: BC Managed Care – PPO

## 2012-04-22 ENCOUNTER — Emergency Department (HOSPITAL_COMMUNITY)
Admission: EM | Admit: 2012-04-22 | Discharge: 2012-04-22 | Disposition: A | Discharge status: Home / Self Care | Payer: BC Managed Care – PPO | Attending: Emergency Medicine | Admitting: Emergency Medicine

## 2012-04-22 DIAGNOSIS — M549 Dorsalgia, unspecified: Secondary | ICD-10-CM | POA: Insufficient documentation

## 2012-04-22 DIAGNOSIS — R141 Gas pain: Secondary | ICD-10-CM | POA: Insufficient documentation

## 2012-04-22 DIAGNOSIS — R142 Eructation: Secondary | ICD-10-CM | POA: Insufficient documentation

## 2012-04-22 DIAGNOSIS — Z87891 Personal history of nicotine dependence: Secondary | ICD-10-CM | POA: Insufficient documentation

## 2012-04-22 DIAGNOSIS — I10 Essential (primary) hypertension: Secondary | ICD-10-CM | POA: Insufficient documentation

## 2012-04-22 DIAGNOSIS — E7800 Pure hypercholesterolemia, unspecified: Secondary | ICD-10-CM | POA: Insufficient documentation

## 2012-04-22 DIAGNOSIS — F411 Generalized anxiety disorder: Secondary | ICD-10-CM | POA: Insufficient documentation

## 2012-04-22 DIAGNOSIS — Z79899 Other long term (current) drug therapy: Secondary | ICD-10-CM | POA: Insufficient documentation

## 2012-04-22 DIAGNOSIS — K219 Gastro-esophageal reflux disease without esophagitis: Secondary | ICD-10-CM

## 2012-04-22 DIAGNOSIS — R079 Chest pain, unspecified: Secondary | ICD-10-CM | POA: Insufficient documentation

## 2012-04-22 DIAGNOSIS — R143 Flatulence: Secondary | ICD-10-CM | POA: Insufficient documentation

## 2012-04-22 DIAGNOSIS — E119 Type 2 diabetes mellitus without complications: Secondary | ICD-10-CM | POA: Insufficient documentation

## 2012-04-22 DIAGNOSIS — F419 Anxiety disorder, unspecified: Secondary | ICD-10-CM

## 2012-04-22 HISTORY — DX: Type 2 diabetes mellitus without complications: E11.9

## 2012-04-22 LAB — BASIC METABOLIC PANEL
Calcium: 10.1 mg/dL (ref 8.4–10.5)
GFR calc non Af Amer: 79 mL/min — ABNORMAL LOW (ref >90)
Sodium: 137 mEq/L (ref 135–145)

## 2012-04-22 LAB — CBC WITH DIFFERENTIAL/PLATELET
Basophils Absolute: 0 10*3/uL (ref 0.0–0.1)
Eosinophils Absolute: 0.4 10*3/uL (ref 0.0–0.7)
Eosinophils Relative: 7 % — ABNORMAL HIGH (ref 0–5)
Lymphocytes Relative: 59 % — ABNORMAL HIGH (ref 12–46)
MCH: 30.6 pg (ref 26.0–34.0)
MCV: 87.2 fL (ref 78.0–100.0)
Platelets: 313 10*3/uL (ref 150–400)
RDW: 13.2 % (ref 11.5–15.5)
WBC: 6 10*3/uL (ref 4.0–10.5)

## 2012-04-22 MED ORDER — FAMOTIDINE 20 MG PO TABS: 20.0000 mg | ORAL_TABLET | Freq: Two times a day (BID) | ORAL | Status: DC

## 2012-04-22 MED ORDER — PANTOPRAZOLE SODIUM 40 MG IV SOLR
40.0000 mg | Freq: Once | INTRAVENOUS | Status: AC
  Administered 2012-04-22: 40 mg via INTRAVENOUS
  Filled 2012-04-22: qty 40

## 2012-04-22 MED ORDER — SODIUM CHLORIDE 0.9 % IV SOLN: INTRAVENOUS | Status: DC

## 2012-04-22 MED ORDER — HYDROCODONE-ACETAMINOPHEN 5-325 MG PO TABS: 1.0000 | ORAL_TABLET | Freq: Four times a day (QID) | ORAL | Status: DC | PRN

## 2012-04-22 MED ORDER — LORAZEPAM 1 MG PO TABS: 1.0000 mg | ORAL_TABLET | Freq: Three times a day (TID) | ORAL | Status: DC | PRN

## 2012-04-22 MED ORDER — LORAZEPAM 2 MG/ML IJ SOLN
1.0000 mg | Freq: Once | INTRAMUSCULAR | Status: AC
  Administered 2012-04-22: 1 mg via INTRAVENOUS
  Filled 2012-04-22: qty 1

## 2012-04-22 MED ORDER — SODIUM CHLORIDE 0.9 % IV BOLUS (SEPSIS)
250.0000 mL | Freq: Once | INTRAVENOUS | Status: AC
  Administered 2012-04-22: 250 mL via INTRAVENOUS

## 2012-04-22 MED ORDER — ONDANSETRON HCL 4 MG/2ML IJ SOLN
4.0000 mg | Freq: Once | INTRAMUSCULAR | Status: AC
Start: 2012-04-22 — End: 2012-04-22
  Administered 2012-04-22: 4 mg via INTRAVENOUS
  Filled 2012-04-22: qty 2

## 2012-04-22 NOTE — ED Provider Notes (Addendum)
History     CSN: [id number]  Arrival date & time 04/22/12  1900   First MD Initiated Contact with Patient 04/22/12 1928      Chief Complaint  Patient presents with  . Chest Pain    (Consider location/radiation/quality/duration/timing/severity/associated sxs/prior treatment) Patient is a 52 y.o. female presenting with chest pain. The history is provided by the patient.  Chest Pain Associated symptoms: back pain   Associated symptoms: no abdominal pain, no diaphoresis, no fever, no headache, no nausea, no shortness of breath and not vomiting    Patient presents with one-week history of left substernal chest pain that radiates to the back when is present at its worst is 10 out of 10 when it's milder 2/10. Associated with anxious feeling belching and heartburn feeling. 2 days ago had bouts of diarrhea but that has resolved. Patient does have cardiac risk factors with high blood pressure high cholesterol and diabetes. Patient has a primary care provider. As stated patient describes the pain as a burning sensation.   Past Medical History  Diagnosis Date  . Hypertension   . History of seasonal allergies   . Hypercholesteremia   . Diabetes mellitus without complication     Past Surgical History  Procedure Laterality Date  . Tubal ligation    . Right hand sugery    . Dilation and curettage of uterus    . Colonoscopy  02/05/2011    Procedure: COLONOSCOPY;  Surgeon: Corbin Ade, MD;  Location: [facility];  Service: Endoscopy;  Laterality: N/A;    Family History  Problem Relation Age of Onset  . Colon cancer Neg Hx     History  Substance Use Topics  . Smoking status: Former Smoker -- 1.00 packs/day for 23 years  . Smokeless tobacco: Not on file  . Alcohol Use: No    OB History   Grav Para Term Preterm Abortions TAB SAB Ect Mult Living                  Review of Systems  Constitutional: Negative for fever and diaphoresis.  HENT: Negative for congestion.   Eyes:  Negative for visual disturbance.  Respiratory: Negative for shortness of breath.   Cardiovascular: Positive for chest pain. Negative for leg swelling.  Gastrointestinal: Negative for nausea, vomiting and abdominal pain.  Genitourinary: Negative for dysuria.  Musculoskeletal: Positive for back pain.  Skin: Negative for rash.  Neurological: Negative for headaches.  Hematological: Does not bruise/bleed easily.    Allergies  Review of patient's allergies indicates no known allergies.  Home Medications   Current Outpatient Rx  Name  Route  Sig  Dispense  Refill  . acetaminophen (TYLENOL) 325 MG tablet   Oral   Take 650 mg by mouth every 6 (six) hours as needed. For pain When needed but seldom         . amLODipine (NORVASC) 2.5 MG tablet   Oral   Take 2.5 mg by mouth daily.         . fluticasone (FLONASE) 50 MCG/ACT nasal spray   Nasal   Place 2 sprays into the nose daily as needed. For allergies          . Multiple Vitamins-Minerals (MULTIVITAMINS THER. W/MINERALS) TABS   Oral   Take 1 tablet by mouth daily.             BP 127/68  Pulse 87  Temp(Src) 98.5 F (36.9 C) (Oral)  Resp 20  Ht 5\' 5"  (1.651  m)  Wt 215 lb (97.523 kg)  BMI 35.78 kg/m2  SpO2 98%  Physical Exam  Nursing note and vitals reviewed. Constitutional: She is oriented to person, place, and time. She appears well-developed and well-nourished. No distress.  HENT:  Head: Normocephalic and atraumatic.  Mouth/Throat: Oropharynx is clear and moist.  Eyes: Conjunctivae and EOM are normal. Pupils are equal, round, and reactive to light.  Neck: Normal range of motion. Neck supple.  Cardiovascular: Normal rate, regular rhythm, normal heart sounds and intact distal pulses.   No murmur heard. Pulmonary/Chest: Effort normal and breath sounds normal. No respiratory distress. She has no wheezes. She has no rales. She exhibits no tenderness.  Abdominal: Soft. Bowel sounds are normal. There is no tenderness.   Musculoskeletal: Normal range of motion.  Neurological: She is alert and oriented to person, place, and time. No cranial nerve deficit. She exhibits normal muscle tone. Coordination normal.  Skin: Skin is warm. No erythema.    ED Course  Procedures (including critical care time)  Labs Reviewed  CBC WITH DIFFERENTIAL - Abnormal; Notable for the following:    Neutrophils Relative 27 (*)    Lymphocytes Relative 59 (*)    Eosinophils Relative 7 (*)    All other components within normal limits  BASIC METABOLIC PANEL - Abnormal; Notable for the following:    Glucose, Bld 111 (*)    GFR calc non Af Amer 79 (*)    All other components within normal limits  TROPONIN I   Dg Chest 2 View  04/22/2012  *RADIOLOGY REPORT*  Clinical Data: Chest pain  CHEST - 2 VIEW  Comparison: 06/07/2007  Findings: The cardiomediastinal silhouette is unremarkable. There is no evidence of focal airspace disease, pulmonary edema, suspicious pulmonary nodule/mass, pleural effusion, or pneumothorax. No acute bony abnormalities are identified.  IMPRESSION: No evidence of active cardiopulmonary disease.   Original Report Authenticated By: Harmon Pier, M.D.    Results for orders placed during the hospital encounter of 04/22/12  TROPONIN I      Result Value Range   Troponin I <0.30  <0.30 ng/mL  CBC WITH DIFFERENTIAL      Result Value Range   WBC 6.0  4.0 - 10.5 K/uL   RBC 4.44  3.87 - 5.11 MIL/uL   Hemoglobin 13.6  12.0 - 15.0 g/dL   HCT 21.3  08.6 - 57.8 %   MCV 87.2  78.0 - 100.0 fL   MCH 30.6  26.0 - 34.0 pg   MCHC 35.1  30.0 - 36.0 g/dL   RDW 46.9  62.9 - 52.8 %   Platelets 313  150 - 400 K/uL   Neutrophils Relative 27 (*) 43 - 77 %   Neutro Abs 1.7  1.7 - 7.7 K/uL   Lymphocytes Relative 59 (*) 12 - 46 %   Lymphs Abs 3.5  0.7 - 4.0 K/uL   Monocytes Relative 6  3 - 12 %   Monocytes Absolute 0.3  0.1 - 1.0 K/uL   Eosinophils Relative 7 (*) 0 - 5 %   Eosinophils Absolute 0.4  0.0 - 0.7 K/uL   Basophils  Relative 1  0 - 1 %   Basophils Absolute 0.0  0.0 - 0.1 K/uL  BASIC METABOLIC PANEL      Result Value Range   Sodium 137  135 - 145 mEq/L   Potassium 3.8  3.5 - 5.1 mEq/L   Chloride 99  96 - 112 mEq/L   CO2 26  19 -  32 mEq/L   Glucose, Bld 111 (*) 70 - 99 mg/dL   BUN 12  6 - 23 mg/dL   Creatinine, Ser 9.60  0.50 - 1.10 mg/dL   Calcium 45.4  8.4 - 09.8 mg/dL   GFR calc non Af Amer 79 (*) >90 mL/min   GFR calc Af Amer >90  >90 mL/min    Date: 04/22/2012  Rate: 85  Rhythm: normal sinus rhythm  QRS Axis: normal  Intervals: normal  ST/T Wave abnormalities: normal  Conduction Disutrbances:none  Narrative Interpretation:   Old EKG Reviewed: unchanged EKG is unchanged from 06/07/2007.   1. Chest pain   2. Anxiety   3. GERD (gastroesophageal reflux disease)       MDM   Patient chest pain is resolved in the emergency department with proton except and Ativan. Workup EKG without any acute changes troponin negative should be positive with the the duration of the chest pain for one week. This is not consistent with acute cardiac event. Chest x-rays negative for pneumonia or pneumothorax or pulmonary edema. No leukocytosis no significant anemia electrolytes are normal. Patient does have cardiac risk factors but today's presentation does not seem to be consistent with acute cardiac event. Patient will followup with primary care Dr. In the next few days.   Clinically feel as if patient's symptoms are predominantly GERD related and maybe some anxiety.        Shelda Jakes, MD 04/22/12 2113  Shelda Jakes, MD 04/22/12 2113

## 2012-04-22 NOTE — ED Notes (Signed)
Pt c/o burning and discomfort in chest x1 week. Pt states "when I burp it goes down and I can breathe better".

## 2012-04-22 NOTE — ED Notes (Signed)
Chest discomfort for 1 week, feels better after burps.  Pain into back .

## 2012-05-25 ENCOUNTER — Encounter: Payer: Self-pay | Admitting: Internal Medicine

## 2012-05-27 ENCOUNTER — Ambulatory Visit (INDEPENDENT_AMBULATORY_CARE_PROVIDER_SITE_OTHER): Payer: BC Managed Care – PPO | Admitting: Gastroenterology

## 2012-05-27 ENCOUNTER — Encounter: Payer: Self-pay | Admitting: Gastroenterology

## 2012-05-27 VITALS — BP 113/67 | HR 82 | Temp 98.3°F | Ht 64.0 in | Wt 223.0 lb

## 2012-05-27 DIAGNOSIS — R140 Abdominal distension (gaseous): Secondary | ICD-10-CM | POA: Insufficient documentation

## 2012-05-27 DIAGNOSIS — K30 Functional dyspepsia: Secondary | ICD-10-CM | POA: Insufficient documentation

## 2012-05-27 DIAGNOSIS — R141 Gas pain: Secondary | ICD-10-CM

## 2012-05-27 DIAGNOSIS — R143 Flatulence: Secondary | ICD-10-CM

## 2012-05-27 DIAGNOSIS — K3189 Other diseases of stomach and duodenum: Secondary | ICD-10-CM

## 2012-05-27 MED ORDER — RESTORA PO CAPS: 1.0000 | ORAL_CAPSULE | Freq: Every day | ORAL | Status: DC

## 2012-05-27 NOTE — Progress Notes (Signed)
Primary Care Physician: Avon Gully, MD  Primary Gastroenterologist:  Roetta Sessions, MD   Chief Complaint  Patient presents with  . Gas  . Bloated    HPI: Cassandra Ballard is a 52 y.o. female here for further evaluation of indigestion and bloating. Recently seen in ED for chest pain. Cardiac etiology ruled out. She thinks it was related to anxiety. Occurred few days after aunt died, watched CPR being performed.   Normal bm, lots of indigestion/flatulence, roaring of stomach, bloating. Heartburn better on pepcid for two weeks. Persistent gas. No dysphagia. Bm 1-2 per day. No melena, brbpr. More flatulence. Greek yogurt once per day for last couple of months. Milk causes diarrhea. Diet green tea. No recent abx.   Current Outpatient Prescriptions  Medication Sig Dispense Refill  . acetaminophen (TYLENOL) 325 MG tablet Take 650 mg by mouth every 6 (six) hours as needed. For pain When needed but seldom      . amLODipine (NORVASC) 2.5 MG tablet Take 2.5 mg by mouth daily.      . fluticasone (FLONASE) 50 MCG/ACT nasal spray Place 2 sprays into the nose daily as needed. For allergies       . HYDROcodone-acetaminophen (NORCO/VICODIN) 5-325 MG per tablet Take 1-2 tablets by mouth every 6 (six) hours as needed for pain.  15 tablet  0  . loratadine (CLARITIN) 10 MG tablet Take 10 mg by mouth daily.      . Multiple Vitamins-Minerals (MULTIVITAMINS THER. W/MINERALS) TABS Take 1 tablet by mouth daily.        . famotidine (PEPCID) 20 MG tablet Take 1 tablet (20 mg total) by mouth 2 (two) times daily.  30 tablet  0  . LORazepam (ATIVAN) 1 MG tablet Take 1 tablet (1 mg total) by mouth 3 (three) times daily as needed for anxiety.  10 tablet  0   No current facility-administered medications for this visit.    Allergies as of 05/27/2012  . (No Known Allergies)   Past Medical History  Diagnosis Date  . Hypertension   . History of seasonal allergies   . Hypercholesteremia   . Diabetes mellitus without  complication     no medications   Past Surgical History  Procedure Laterality Date  . Tubal ligation    . Right hand sugery    . Dilation and curettage of uterus    . Colonoscopy  02/05/2011    RMR:4 mm polyp in the ascending/segment (tubular adenoma) ; otherwise normal colon. multiple diminutive hyperplastic. next TCS 01/2018    ROS:  General: Negative for anorexia, weight loss, fever, chills, fatigue, weakness. ENT: Negative for hoarseness, difficulty swallowing , nasal congestion. CV: Negative for chest pain, angina, palpitations, dyspnea on exertion, peripheral edema. See hpi Respiratory: Negative for dyspnea at rest, dyspnea on exertion, cough, sputum, wheezing.  GI: See history of present illness. GU:  Negative for dysuria, hematuria, urinary incontinence, urinary frequency, nocturnal urination.  Endo: Negative for unusual weight change.    Physical Examination:   BP 113/67  Pulse 82  Temp(Src) 98.3 F (36.8 C) (Oral)  Ht 5\' 4"  (1.626 m)  Wt 223 lb (101.152 kg)  BMI 38.26 kg/m2  General: Well-nourished, well-developed in no acute distress.  Eyes: No icterus. Mouth: Oropharyngeal mucosa moist and pink , no lesions erythema or exudate. Lungs: Clear to auscultation bilaterally.  Heart: Regular rate and rhythm, no murmurs rubs or gallops.  Abdomen: Bowel sounds are normal, nontender, nondistended, no hepatosplenomegaly or masses, no abdominal bruits or hernia ,  no rebound or guarding.   Extremities: No lower extremity edema. No clubbing or deformities. Neuro: Alert and oriented x 4   Skin: Warm and dry, no jaundice.   Psych: Alert and cooperative, normal mood and affect.  Labs:  Lab Results  Component Value Date   WBC 6.0 04/22/2012   HGB 13.6 04/22/2012   HCT 38.7 04/22/2012   MCV 87.2 04/22/2012   PLT 313 04/22/2012   Lab Results  Component Value Date   CREATININE 0.84 04/22/2012   BUN 12 04/22/2012   NA 137 04/22/2012   K 3.8 04/22/2012   CL 99 04/22/2012   CO2  26 04/22/2012     Imaging Studies: No results found.

## 2012-05-27 NOTE — Patient Instructions (Addendum)
1. Start Restora one capsule daily for next one month. We have given you a coupon. Medication is over-the-counter at local pharmacies. 2. Please limit artificial sweeteners as they can cause bloating, gas. 3. Review the diet information we have provided and limit "gassy" foods as much as possible. 4. Office visit in 4-6 weeks for follow-up. If not better at that time, then we may consider further work-up.  Bloating Bloating is the feeling of fullness in your belly. You may feel as though your pants are too tight. Often the cause of bloating is overeating, retaining fluids, or having gas in your bowel. It is also caused by swallowing air and eating foods that cause gas. Irritable bowel syndrome is one of the most common causes of bloating. Constipation is also a common cause. Sometimes more serious problems can cause bloating. SYMPTOMS  Usually there is a feeling of fullness, as though your abdomen is bulged out. There may be mild discomfort.  DIAGNOSIS  Usually no particular testing is necessary for most bloating. If the condition persists and seems to become worse, your caregiver may do additional testing.  TREATMENT   There is no direct treatment for bloating.  Do not put gas into the bowel. Avoid chewing gum and sucking on candy. These tend to make you swallow air. Swallowing air can also be a nervous habit. Try to avoid this.  Avoiding high residue diets will help. Eat foods with soluble fibers (examples include root vegetables, apples, or barley) and substitute dairy products with soy and rice products. This helps irritable bowel syndrome.  If constipation is the cause, then a high residue diet with more fiber will help.  Avoid carbonated beverages.  Over-the-counter preparations are available that help reduce gas. Your pharmacist can help you with this. SEEK MEDICAL CARE IF:   Bloating continues and seems to be getting worse.  You notice a weight gain.  You have a weight loss but  the bloating is getting worse.  You have changes in your bowel habits or develop nausea or vomiting. SEEK IMMEDIATE MEDICAL CARE IF:   You develop shortness of breath or swelling in your legs.  You have an increase in abdominal pain or develop chest pain. Document Released: 12/11/2005 Document Revised: 05/05/2011 Document Reviewed: 01/29/2007 [facility] Patient Information 2013 [place], [place].  Indigestion Indigestion is discomfort in the upper abdomen that is caused by underlying problems such as gastroesophageal reflux disease (GERD), ulcers, or gallbladder problems.  CAUSES  Indigestion can be caused by many things. Possible causes include:  Stomach acid in the esophagus.  Stomach infections, usually caused by the bacteria H. pylori.  Being overweight.  Hiatal hernia. This means part of the stomach pushes up through the diaphragm.  Overeating.  Emotional problems, such as stress, anxiety, or depression.  Poor nutrition.  Consuming too much alcohol, tobacco, or caffeine.  Consuming spicy foods, fats, peppermint, chocolate, tomato products, citrus, or fruit juices.  Medicines such as aspirin and other anti-inflammatory drugs, hormones, steroids, and thyroid medicines.  Gastroparesis. This is a condition in which the stomach does not empty properly.  Stomach cancer.  Pregnancy, due to an increase in hormone levels, a relaxation of muscles in the digestive tract, and pressure on the stomach from the growing fetus. SYMPTOMS   Uncomfortable feeling of fullness after eating.  Pain or burning sensation in the upper abdomen.  Bloating.  Belching and gas.  Nausea and vomiting.  Acidic taste in the mouth.  Burning sensation in the chest (heartburn). DIAGNOSIS  Your caregiver will review your medical history and perform a physical exam. Other tests, such as blood tests, stool tests, X-rays, and other imaging scans, may be done to check for more serious  problems. TREATMENT  Liquid antacids and other drugs may be given to block stomach acid secretion. Medicines that increase esophageal muscle tone may also be given to help reduce symptoms. If an infection is found, antibiotic medicine may be given. HOME CARE INSTRUCTIONS  Avoid foods and drinks that make your symptoms worse, such as:  Caffeine or alcoholic drinks.  Chocolate.  Peppermint or mint flavorings.  Garlic and onions.  Spicy foods.  Citrus fruits, such as oranges, lemons, or limes.  Tomato-based foods such as sauce, chili, salsa, and pizza.  Fried and fatty foods.  Avoid eating for the 3 hours prior to your bedtime.  Eat small, frequent meals instead of large meals.  Stop smoking if you smoke.  Maintain a healthy weight.  Wear loose-fitting clothing. Do not wear anything tight around your waist that causes pressure on your stomach.  Raise the head of your bed 4 to 8 inches with wood blocks to help you sleep. Extra pillows will not help.  Only take over-the-counter or prescription medicines as directed by your caregiver.  Do not take aspirin, ibuprofen, or other nonsteroidal anti-inflammatory drugs (NSAIDs). SEEK IMMEDIATE MEDICAL CARE IF:   You are not better after 2 days.  You have chest pressure or pain that radiates up into your neck, arms, back, jaw, or upper abdomen.  You have difficulty swallowing.  You keep vomiting.  You have black or bloody stools.  You have a fever.  You have dizziness, fainting, difficulty breathing, or heavy sweating.  You have severe abdominal pain.  You lose weight without trying. MAKE SURE YOU:  Understand these instructions.  Will watch your condition.  Will get help right away if you are not doing well or get worse. Document Released: 03/20/2004 Document Revised: 05/05/2011 Document Reviewed: 09/25/2010 [facility] Patient Information 2013 [place], [place]. Flatulence Burping releases air that you swallow. The  bubbles from some drinks may cause burps. There are good germs in your gut to help you digest food. Gas is produced by these germs and released from your bottom. Most people release 3 to 4 quarts of gas every day. This is normal. HOME CARE  Eat or drink less of the foods or liquids that give you gas.  Take the time to chew your food well. Talk less while you eat.  Do not suck on ice or hard candy.  Sip slowly. Stir some of the bubbles out of fizzy drinks with a spoon or straw.  Avoid chewing gum or smoking.  Ask your doctor about liquids and tablets that may help control burping and gas.  Only take medicine as told by your doctor. GET HELP RIGHT AWAY IF:   There is discomfort when you burp or pass gas.  You throw up (vomit) when you burp.  Poop (stool) comes out when you pass gas.  Your belly is puffy (swollen) and hard. MAKE SURE YOU:   Understand these instructions.  Will watch your condition.  Will get help right away if you are not doing well or get worse. Document Released: 12/14/2007 Document Revised: 05/05/2011 Document Reviewed: 12/14/2007 [facility] Patient Information 2013 [place], [place].

## 2012-05-28 ENCOUNTER — Encounter: Payer: Self-pay | Admitting: Gastroenterology

## 2012-05-28 NOTE — Assessment & Plan Note (Addendum)
Recent bloating/indigestion/flatulence. No change in bowels. No heartburn on Pepcid. Describes lactose intolerance. No recent antibiotic use. Colonoscopy [place] to date. Add Restora one daily for four weeks. Gas/bloat information provided. She needs to limit her artificial sweetners. Try to retrieve copy of last EGD report around 2002. OV in 4-6 weeks.

## 2012-05-31 NOTE — Progress Notes (Signed)
Cc PCP 

## 2012-06-04 ENCOUNTER — Telehealth: Payer: Self-pay | Admitting: Internal Medicine

## 2012-06-04 MED ORDER — RESTORA PO CAPS: 1.0000 | ORAL_CAPSULE | Freq: Every day | ORAL | Status: DC

## 2012-06-04 NOTE — Telephone Encounter (Signed)
done

## 2012-06-04 NOTE — Telephone Encounter (Signed)
Pt was seen by [facility] recently and was given a coupon for Restora. Her pharmacist said that the coupon was only good for Restora written as a prescription. Can we call in the prescription to [facility] in [place] for her so she can use her coupon? [phone]

## 2012-06-22 ENCOUNTER — Other Ambulatory Visit (HOSPITAL_COMMUNITY): Payer: Self-pay | Admitting: Internal Medicine

## 2012-06-22 DIAGNOSIS — Z139 Encounter for screening, unspecified: Secondary | ICD-10-CM

## 2012-07-01 ENCOUNTER — Ambulatory Visit: Payer: BC Managed Care – PPO | Admitting: Gastroenterology

## 2012-07-30 ENCOUNTER — Ambulatory Visit (HOSPITAL_COMMUNITY)
Admission: RE | Admit: 2012-07-30 | Discharge: 2012-07-30 | Disposition: A | Discharge status: Home / Self Care | Payer: BC Managed Care – PPO | Source: Ambulatory Visit | Attending: Internal Medicine | Admitting: Internal Medicine

## 2012-07-30 ENCOUNTER — Telehealth: Payer: Self-pay | Admitting: Gastroenterology

## 2012-07-30 DIAGNOSIS — Z1231 Encounter for screening mammogram for malignant neoplasm of breast: Secondary | ICD-10-CM | POA: Insufficient documentation

## 2012-07-30 DIAGNOSIS — Z139 Encounter for screening, unspecified: Secondary | ICD-10-CM

## 2012-07-30 NOTE — Telephone Encounter (Signed)
Message copied by Tiffany Kocher on Fri Jul 30, 2012  8:51 AM ------      Message from: Diana Eves D      Created: Thu May 27, 2012  2:21 PM       Trish from [facility] medical records called to let me know they do not have an EGD report on Iva Lento Lodema Hong)  ------

## 2012-08-03 ENCOUNTER — Ambulatory Visit (INDEPENDENT_AMBULATORY_CARE_PROVIDER_SITE_OTHER): Payer: BC Managed Care – PPO | Admitting: Obstetrics & Gynecology

## 2012-08-03 ENCOUNTER — Encounter: Payer: Self-pay | Admitting: Obstetrics & Gynecology

## 2012-08-03 VITALS — BP 120/80 | Ht 64.0 in | Wt 223.0 lb

## 2012-08-03 DIAGNOSIS — Z01419 Encounter for gynecological examination (general) (routine) without abnormal findings: Secondary | ICD-10-CM

## 2012-08-03 DIAGNOSIS — Z1212 Encounter for screening for malignant neoplasm of rectum: Secondary | ICD-10-CM

## 2012-08-03 NOTE — Progress Notes (Signed)
Patient ID: Cassandra Ballard, female   DOB: Sep 22, 1960, 52 y.o.   MRN: [id number] Subjective:     Cassandra Ballard is a 52 y.o. female here for a routine exam.  No LMP recorded. Patient is postmenopausal. No obstetric history on file. Current complaints: none.  Personal health questionnaire reviewed: no.   Gynecologic History No LMP recorded. Patient is postmenopausal. Contraception: post menopausal status Last Pap: 2013. Results were: normal Last mammogram: 2013. Results were: normal  Obstetric History OB History   Grav Para Term Preterm Abortions TAB SAB Ect Mult Living                   The following portions of the patient's history were reviewed and updated as appropriate: allergies, current medications, past family history, past medical history, past social history, past surgical history and problem list.  Review of Systems  Review of Systems  Constitutional: Negative for fever, chills, weight loss, malaise/fatigue and diaphoresis.  HENT: Negative for hearing loss, ear pain, nosebleeds, congestion, sore throat, neck pain, tinnitus and ear discharge.   Eyes: Negative for blurred vision, double vision, photophobia, pain, discharge and redness.  Respiratory: Negative for cough, hemoptysis, sputum production, shortness of breath, wheezing and stridor.   Cardiovascular: Negative for chest pain, palpitations, orthopnea, claudication, leg swelling and PND.  Gastrointestinal: negative for abdominal pain. Negative for heartburn, nausea, vomiting, diarrhea, constipation, blood in stool and melena.  Genitourinary: Negative for dysuria, urgency, frequency, hematuria and flank pain.  Musculoskeletal: Negative for myalgias, back pain, joint pain and falls.  Skin: Negative for itching and rash.  Neurological: Negative for dizziness, tingling, tremors, sensory change, speech change, focal weakness, seizures, loss of consciousness, weakness and headaches.  Endo/Heme/Allergies: Negative for  environmental allergies and polydipsia. Does not bruise/bleed easily.  Psychiatric/Behavioral: Negative for depression, suicidal ideas, hallucinations, memory loss and substance abuse. The patient is not nervous/anxious and does not have insomnia.        Objective:    Physical Exam  Vitals reviewed. Constitutional: She is oriented to person, place, and time. She appears well-developed and well-nourished.  HENT:  Head: Normocephalic and atraumatic.        Right Ear: External ear normal.  Left Ear: External ear normal.  Nose: Nose normal.  Mouth/Throat: Oropharynx is clear and moist.  Eyes: Conjunctivae and EOM are normal. Pupils are equal, round, and reactive to light. Right eye exhibits no discharge. Left eye exhibits no discharge. No scleral icterus.  Neck: Normal range of motion. Neck supple. No tracheal deviation present. No thyromegaly present.  Cardiovascular: Normal rate, regular rhythm, normal heart sounds and intact distal pulses.  Exam reveals no gallop and no friction rub.   No murmur heard. Respiratory: Effort normal and breath sounds normal. No respiratory distress. She has no wheezes. She has no rales. She exhibits no tenderness.  GI: Soft. Bowel sounds are normal. She exhibits no distension and no mass. There is no tenderness. There is no rebound and no guarding.  Genitourinary:       Vulva is normal without lesions Vagina is pink moist without discharge Cervix normal in appearance and pap is done Uterus is normal size shape and contour Adnexa is negative with normal sized ovaries   Musculoskeletal: Normal range of motion. She exhibits no edema and no tenderness.  Neurological: She is alert and oriented to person, place, and time. She has normal reflexes. She displays normal reflexes. No cranial nerve deficit. She exhibits normal muscle tone. Coordination normal.  Skin:  Skin is warm and dry. No rash noted. No erythema. No pallor.  Psychiatric: She has a normal mood and  affect. Her behavior is normal. Judgment and thought content normal.       Assessment:    Healthy female exam.    Plan:    Follow up in: 1 year.

## 2012-09-03 ENCOUNTER — Encounter (HOSPITAL_COMMUNITY): Payer: Self-pay

## 2012-09-03 ENCOUNTER — Emergency Department (HOSPITAL_COMMUNITY): Payer: BC Managed Care – PPO

## 2012-09-03 ENCOUNTER — Emergency Department (HOSPITAL_COMMUNITY)
Admission: EM | Admit: 2012-09-03 | Discharge: 2012-09-03 | Disposition: A | Discharge status: Home / Self Care | Payer: BC Managed Care – PPO | Attending: Emergency Medicine | Admitting: Emergency Medicine

## 2012-09-03 DIAGNOSIS — E119 Type 2 diabetes mellitus without complications: Secondary | ICD-10-CM | POA: Insufficient documentation

## 2012-09-03 DIAGNOSIS — Z79899 Other long term (current) drug therapy: Secondary | ICD-10-CM | POA: Insufficient documentation

## 2012-09-03 DIAGNOSIS — Z862 Personal history of diseases of the blood and blood-forming organs and certain disorders involving the immune mechanism: Secondary | ICD-10-CM | POA: Insufficient documentation

## 2012-09-03 DIAGNOSIS — IMO0002 Reserved for concepts with insufficient information to code with codable children: Secondary | ICD-10-CM | POA: Insufficient documentation

## 2012-09-03 DIAGNOSIS — Z87891 Personal history of nicotine dependence: Secondary | ICD-10-CM | POA: Insufficient documentation

## 2012-09-03 DIAGNOSIS — S39012A Strain of muscle, fascia and tendon of lower back, initial encounter: Secondary | ICD-10-CM

## 2012-09-03 DIAGNOSIS — S139XXA Sprain of joints and ligaments of unspecified parts of neck, initial encounter: Secondary | ICD-10-CM | POA: Insufficient documentation

## 2012-09-03 DIAGNOSIS — I10 Essential (primary) hypertension: Secondary | ICD-10-CM | POA: Insufficient documentation

## 2012-09-03 DIAGNOSIS — S335XXA Sprain of ligaments of lumbar spine, initial encounter: Secondary | ICD-10-CM | POA: Insufficient documentation

## 2012-09-03 DIAGNOSIS — Z8639 Personal history of other endocrine, nutritional and metabolic disease: Secondary | ICD-10-CM | POA: Insufficient documentation

## 2012-09-03 DIAGNOSIS — Y9389 Activity, other specified: Secondary | ICD-10-CM | POA: Insufficient documentation

## 2012-09-03 DIAGNOSIS — S161XXA Strain of muscle, fascia and tendon at neck level, initial encounter: Secondary | ICD-10-CM

## 2012-09-03 DIAGNOSIS — Y92410 Unspecified street and highway as the place of occurrence of the external cause: Secondary | ICD-10-CM | POA: Insufficient documentation

## 2012-09-03 DIAGNOSIS — S0990XA Unspecified injury of head, initial encounter: Secondary | ICD-10-CM | POA: Insufficient documentation

## 2012-09-03 MED ORDER — HYDROCODONE-ACETAMINOPHEN 5-325 MG PO TABS: 1.0000 | ORAL_TABLET | Freq: Four times a day (QID) | ORAL | Status: DC | PRN
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
## 2012-09-03 NOTE — ED Notes (Signed)
C-collar removed after radiology reports read. Pt assisted to restroom at that time. Pt noted with a steady gait. Denies dizziness and light-headedness at this time.
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
## 2012-09-08 ENCOUNTER — Telehealth (HOSPITAL_COMMUNITY): Payer: Self-pay | Admitting: Emergency Medicine

## 2013-04-12 ENCOUNTER — Ambulatory Visit: Payer: BC Managed Care – PPO | Admitting: Gastroenterology

## 2013-04-27 ENCOUNTER — Encounter: Payer: Self-pay | Admitting: Gastroenterology

## 2013-04-27 ENCOUNTER — Ambulatory Visit (INDEPENDENT_AMBULATORY_CARE_PROVIDER_SITE_OTHER): Payer: BC Managed Care – PPO | Admitting: Gastroenterology

## 2013-04-27 ENCOUNTER — Encounter (HOSPITAL_COMMUNITY): Payer: Self-pay | Admitting: Pharmacy Technician

## 2013-04-27 ENCOUNTER — Encounter (INDEPENDENT_AMBULATORY_CARE_PROVIDER_SITE_OTHER): Payer: Self-pay

## 2013-04-27 VITALS — BP 125/77 | HR 81 | Temp 97.6°F | Ht 65.0 in | Wt 229.0 lb

## 2013-04-27 DIAGNOSIS — R0789 Other chest pain: Secondary | ICD-10-CM

## 2013-04-27 DIAGNOSIS — K219 Gastro-esophageal reflux disease without esophagitis: Secondary | ICD-10-CM

## 2013-04-27 MED ORDER — ESOMEPRAZOLE MAGNESIUM 40 MG PO CPDR: 40.0000 mg | DELAYED_RELEASE_CAPSULE | Freq: Every day | ORAL | Status: DC

## 2013-04-27 NOTE — Progress Notes (Signed)
Primary Care Physician:  Rosita Fire, MD  Primary Gastroenterologist:  Garfield Cornea, MD   Chief Complaint  Patient presents with  . Gastrophageal Reflux    HPI:  Cassandra Ballard is a 53 y.o. female here for followup. Last seen in April 2014 for indigestion and bloating.   C/O chest discomfort every day. As soon as wakes up in the morning. Pain in chest and into back. Not exertional. No diaphoresis. Gets anxious when it occurs. Once burp feels better. Lots of gas. Some regurgitation. No heartburn. BMs are good. No abdominal pain. Good appetite but afraid to eat. Doesn't matter what or how much eats. No meds for it. No dysphagia or odynophagia. Rare Advil. No melena, brbpr.   Current Outpatient Prescriptions  Medication Sig Dispense Refill  . acetaminophen (TYLENOL) 325 MG tablet Take 650 mg by mouth every 6 (six) hours as needed. For pain When needed but seldom      . amLODipine (NORVASC) 2.5 MG tablet Take 2.5 mg by mouth daily.      . fluticasone (FLONASE) 50 MCG/ACT nasal spray Place 2 sprays into the nose daily as needed. For allergies       . HYDROcodone-acetaminophen (NORCO/VICODIN) 5-325 MG per tablet Take 1 tablet by mouth every 6 (six) hours as needed for pain.  20 tablet  0  . ibuprofen (ADVIL,MOTRIN) 200 MG tablet Take 200 mg by mouth every 8 (eight) hours as needed for pain. pain      . loratadine (CLARITIN) 10 MG tablet Take 10 mg by mouth daily.       No current facility-administered medications for this visit.    Allergies as of 04/27/2013  . (No Known Allergies)    Past Medical History  Diagnosis Date  . Hypertension   . History of seasonal allergies   . Hypercholesteremia   . Diabetes mellitus without complication     no medications    Past Surgical History  Procedure Laterality Date  . Tubal ligation    . Right hand sugery    . Dilation and curettage of uterus    . Colonoscopy  02/05/2011    RMR:4 mm polyp in the ascending/segment (tubular adenoma) ;  otherwise normal colon. multiple diminutive hyperplastic. next TCS 01/2018    Family History  Problem Relation Age of Onset  . Colon cancer Neg Hx   . Lung cancer Mother   . Lung cancer Father     History   Social History  . Marital Status: Married    Spouse Name: N/A    Number of Children: 2  . Years of Education: N/A   Occupational History  .     Social History Main Topics  . Smoking status: Former Smoker -- 1.00 packs/day for 23 years  . Smokeless tobacco: Not on file     Comment: quit 2002  . Alcohol Use: No  . Drug Use: No  . Sexual Activity: Yes   Other Topics Concern  . Not on file   Social History Narrative  . No narrative on file      ROS:  General: Negative for anorexia, weight loss, fever, chills, fatigue, weakness. Eyes: Negative for vision changes.  ENT: Negative for hoarseness, difficulty swallowing , nasal congestion. CV: Negative for chest pain, angina, palpitations, dyspnea on exertion, peripheral edema.  Respiratory: Negative for dyspnea at rest, dyspnea on exertion, cough, sputum, wheezing.  GI: See history of present illness. GU:  Negative for dysuria, hematuria, urinary incontinence, urinary frequency, nocturnal urination.  MS: Negative for joint pain, low back pain.  Derm: Negative for rash or itching.  Neuro: Negative for weakness, abnormal sensation, seizure, frequent headaches, memory loss, confusion.  Psych: Negative for anxiety, depression, suicidal ideation, hallucinations.  Endo: Negative for unusual weight change.  Heme: Negative for bruising or bleeding. Allergy: Negative for rash or hives.    Physical Examination:  BP 125/77  Pulse 81  Temp(Src) 97.6 F (36.4 C) (Oral)  Ht 5\' 5"  (1.651 m)  Wt 229 lb (103.874 kg)  BMI 38.11 kg/m2   General: Well-nourished, well-developed in no acute distress.  Head: Normocephalic, atraumatic.   Eyes: Conjunctiva pink, no icterus. Mouth: Oropharyngeal mucosa moist and pink , no lesions  erythema or exudate. Neck: Supple without thyromegaly, masses, or lymphadenopathy.  Lungs: Clear to auscultation bilaterally.  Heart: Regular rate and rhythm, no murmurs rubs or gallops.  Abdomen: Bowel sounds are normal, nontender, nondistended, no hepatosplenomegaly or masses, no abdominal bruits or    hernia , no rebound or guarding.   Rectal: not performed Extremities: No lower extremity edema. No clubbing or deformities.  Neuro: Alert and oriented x 4 , grossly normal neurologically.  Skin: Warm and dry, no rash or jaundice.   Psych: Alert and cooperative, normal mood and affect.

## 2013-04-27 NOTE — Patient Instructions (Signed)
1. Start Nexium one capsule daily before breakfast. Samples provided. I have also sent RX to your pharmacy and rebate card provided. 2. Upper endoscopy as scheduled. See separate instructions.

## 2013-04-27 NOTE — Assessment & Plan Note (Addendum)
53 y/o female complains of daily atypical chest discomfort with radiation into back. Worse with meals. Some relief with belching. Has not been on PPI recently. Remote EGD per patient in 2004 but we have not been able to locate record after multiple attempts. Patient did have ER evaluation with these symptoms back last fall. Recommend EGD for further evaluation. Suspect atypical GERD.  Biliary etiology not excluded.  I have discussed the risks, alternatives, benefits with regards to but not limited to the risk of reaction to medication, bleeding, infection, perforation and the patient is agreeable to proceed. Written consent to be obtained.  Begin Nexium 40mg  daily.

## 2013-04-28 NOTE — Progress Notes (Signed)
cc'd to pcp 

## 2013-05-16 ENCOUNTER — Encounter (HOSPITAL_COMMUNITY): Admission: RE | Payer: Self-pay | Source: Ambulatory Visit

## 2013-05-16 ENCOUNTER — Ambulatory Visit (HOSPITAL_COMMUNITY)
Admission: RE | Admit: 2013-05-16 | Payer: BC Managed Care – PPO | Source: Ambulatory Visit | Admitting: Internal Medicine

## 2013-05-16 SURGERY — EGD (ESOPHAGOGASTRODUODENOSCOPY)
Anesthesia: Moderate Sedation

## 2013-07-14 ENCOUNTER — Other Ambulatory Visit (HOSPITAL_COMMUNITY): Payer: Self-pay | Admitting: Internal Medicine

## 2013-07-14 DIAGNOSIS — Z139 Encounter for screening, unspecified: Secondary | ICD-10-CM

## 2013-08-01 ENCOUNTER — Ambulatory Visit (HOSPITAL_COMMUNITY)
Admission: RE | Admit: 2013-08-01 | Discharge: 2013-08-01 | Disposition: A | Discharge status: Home / Self Care | Payer: BC Managed Care – PPO | Source: Ambulatory Visit | Attending: Internal Medicine | Admitting: Internal Medicine

## 2013-08-01 DIAGNOSIS — Z139 Encounter for screening, unspecified: Secondary | ICD-10-CM

## 2013-08-01 DIAGNOSIS — Z1231 Encounter for screening mammogram for malignant neoplasm of breast: Secondary | ICD-10-CM | POA: Insufficient documentation

## 2013-12-07 ENCOUNTER — Other Ambulatory Visit: Payer: BC Managed Care – PPO | Admitting: Obstetrics & Gynecology

## 2013-12-13 ENCOUNTER — Other Ambulatory Visit (HOSPITAL_COMMUNITY)
Admission: RE | Admit: 2013-12-13 | Discharge: 2013-12-13 | Disposition: A | Discharge status: Home / Self Care | Payer: BC Managed Care – PPO | Source: Ambulatory Visit | Attending: Advanced Practice Midwife | Admitting: Advanced Practice Midwife

## 2013-12-13 ENCOUNTER — Encounter: Payer: Self-pay | Admitting: Advanced Practice Midwife

## 2013-12-13 ENCOUNTER — Other Ambulatory Visit: Payer: BC Managed Care – PPO | Admitting: Obstetrics & Gynecology

## 2013-12-13 ENCOUNTER — Ambulatory Visit (INDEPENDENT_AMBULATORY_CARE_PROVIDER_SITE_OTHER): Payer: BC Managed Care – PPO | Admitting: Advanced Practice Midwife

## 2013-12-13 VITALS — BP 130/72 | Ht 64.0 in | Wt 227.0 lb

## 2013-12-13 DIAGNOSIS — I10 Essential (primary) hypertension: Secondary | ICD-10-CM | POA: Insufficient documentation

## 2013-12-13 DIAGNOSIS — Z1151 Encounter for screening for human papillomavirus (HPV): Secondary | ICD-10-CM | POA: Insufficient documentation

## 2013-12-13 DIAGNOSIS — E119 Type 2 diabetes mellitus without complications: Secondary | ICD-10-CM | POA: Insufficient documentation

## 2013-12-13 DIAGNOSIS — E7800 Pure hypercholesterolemia, unspecified: Secondary | ICD-10-CM | POA: Insufficient documentation

## 2013-12-13 DIAGNOSIS — Z01419 Encounter for gynecological examination (general) (routine) without abnormal findings: Secondary | ICD-10-CM | POA: Insufficient documentation

## 2013-12-13 MED ORDER — FLUCONAZOLE 150 MG PO TABS: ORAL_TABLET | ORAL | Status: DC

## 2013-12-13 NOTE — Addendum Note (Signed)
Addended by: Farley Ly on: 12/13/2013 10:19 AM   Modules accepted: Orders

## 2013-12-13 NOTE — Progress Notes (Signed)
Eliseo Gum Carlini 53 y.o.  Filed Vitals:   12/13/13 0845  BP: 130/72     Past Medical History: Past Medical History  Diagnosis Date  . History of seasonal allergies   . Hypercholesteremia   . Diabetes mellitus without complication     no medications  . Hypertension     norvasc    Past Surgical History: Past Surgical History  Procedure Laterality Date  . Tubal ligation    . Right hand sugery    . Dilation and curettage of uterus    . Colonoscopy  02/05/2011    RMR:4 mm polyp in the ascending/segment (tubular adenoma) ; otherwise normal colon. multiple diminutive hyperplastic. next TCS 01/2018    Family History: Family History  Problem Relation Age of Onset  . Colon cancer Neg Hx   . Lung cancer Mother   . Hypertension Mother   . Lung cancer Father   . Hypertension Father   . Hypertension Sister   . Hypertension Brother   . Hypertension Sister   . Hypertension Brother   . Hypertension Brother   . Stroke Brother     Social History: History  Substance Use Topics  . Smoking status: Former Smoker -- 1.00 packs/day for 23 years  . Smokeless tobacco: Never Used     Comment: quit 2002  . Alcohol Use: No    Allergies: No Known Allergies   Current outpatient prescriptions:amLODipine (NORVASC) 2.5 MG tablet, Take 2.5 mg by mouth daily., Disp: , Rfl: ;  cetirizine (ZYRTEC) 10 MG tablet, Take 10 mg by mouth daily., Disp: , Rfl: ;  fluticasone (FLONASE) 50 MCG/ACT nasal spray, Place 2 sprays into the nose daily as needed for allergies. , Disp: , Rfl: ;  ibuprofen (ADVIL,MOTRIN) 200 MG tablet, Take 400 mg by mouth every 8 (eight) hours as needed for mild pain. , Disp: , Rfl:  esomeprazole (NEXIUM) 40 MG capsule, Take 1 capsule (40 mg total) by mouth daily before breakfast., Disp: 30 capsule, Rfl: 11;  fluconazole (DIFLUCAN) 150 MG tablet, 1 po stat; repeat in 3 days, Disp: 2 tablet, Rfl: 2  History of Present Illness: Here for pap/physical. Had negative HPV 6/14 w/o pap.   Wants to get pap "since I'm here".  C/O vaginal itch, discharge and "yeasty" odor. Used a 1 day tx 1 week ago:  States itch is gone, but still c/o thick white d/c with odor. Has not seen PCP 'in a while":  States has been given dx of DM, but never placed on meds. Also states cholesterol was "high" but told to "watch it".   Had normal screening colonoscopy this year, and mammogram a few months ago was also normal.   Review of Systems   Patient denies any headaches, blurred vision, shortness of breath, chest pain, abdominal pain, problems with bowel movements, urination, or intercourse.   Physical Exam: General:  Well developed, well nourished, no acute distress Skin:  Warm and dry Neck:  Midline trachea, normal thyroid Lungs; Clear to auscultation bilaterally Breast:  No dominant palpable mass, retraction, or nipple discharge Cardiovascular: Regular rate and rhythm Abdomen:  Soft, non tender, no hepatosplenomegaly Pelvic:  External genitalia is normal in appearance.  The vagina is normal in appearance.  The cervix is bulbous.  Uterus is felt to be normal size, shape, and contour, although exam limited by body habitus.  No adnexal masses or tenderness noted.  Extremities:  No swelling or varicosities noted Psych:  No mood changes.     Impression:  Normal GYN exam.  Will treat for yeast. If sx not cleared up, will try course of Flagyl     Plan: Fasting labs:  Lipid panel, CBC, CMP, TSH, Hgb A1C.  If abnormal, will route to PCP for F/U

## 2013-12-14 LAB — CYTOLOGY - PAP

## 2013-12-20 ENCOUNTER — Other Ambulatory Visit: Payer: BC Managed Care – PPO

## 2013-12-20 ENCOUNTER — Other Ambulatory Visit: Payer: BC Managed Care – PPO | Admitting: Obstetrics & Gynecology

## 2013-12-20 DIAGNOSIS — Z13220 Encounter for screening for lipoid disorders: Secondary | ICD-10-CM

## 2013-12-20 DIAGNOSIS — Z0189 Encounter for other specified special examinations: Secondary | ICD-10-CM

## 2013-12-20 DIAGNOSIS — Z1329 Encounter for screening for other suspected endocrine disorder: Secondary | ICD-10-CM

## 2013-12-20 LAB — CBC
HEMATOCRIT: 38.1 % (ref 36.0–46.0)
HEMOGLOBIN: 13.1 g/dL (ref 12.0–15.0)
MCH: 29.4 pg (ref 26.0–34.0)
MCHC: 34.4 g/dL (ref 30.0–36.0)
MCV: 85.6 fL (ref 78.0–100.0)
Platelets: 304 10*3/uL (ref 150–400)
RBC: 4.45 MIL/uL (ref 3.87–5.11)
RDW: 14.1 % (ref 11.5–15.5)
WBC: 5.6 10*3/uL (ref 4.0–10.5)

## 2013-12-21 ENCOUNTER — Encounter: Payer: Self-pay | Admitting: Advanced Practice Midwife

## 2013-12-21 LAB — COMPREHENSIVE METABOLIC PANEL
ALT: 28 U/L (ref 0–35)
AST: 24 U/L (ref 0–37)
Albumin: 4.2 g/dL (ref 3.5–5.2)
Alkaline Phosphatase: 66 U/L (ref 39–117)
BILIRUBIN TOTAL: 0.2 mg/dL (ref 0.2–1.2)
BUN: 10 mg/dL (ref 6–23)
CALCIUM: 9.1 mg/dL (ref 8.4–10.5)
CHLORIDE: 106 meq/L (ref 96–112)
CO2: 25 meq/L (ref 19–32)
CREATININE: 0.88 mg/dL (ref 0.50–1.10)
Glucose, Bld: 104 mg/dL — ABNORMAL HIGH (ref 70–99)
Potassium: 4.3 mEq/L (ref 3.5–5.3)
Sodium: 140 mEq/L (ref 135–145)
Total Protein: 7 g/dL (ref 6.0–8.3)

## 2013-12-21 LAB — HEMOGLOBIN A1C
HEMOGLOBIN A1C: 6.5 % — AB (ref <5.7)
MEAN PLASMA GLUCOSE: 140 mg/dL — AB (ref <117)

## 2013-12-21 LAB — LIPID PANEL
CHOLESTEROL: 194 mg/dL (ref 0–200)
HDL: 46 mg/dL (ref >39)
LDL Cholesterol: 117 mg/dL — ABNORMAL HIGH (ref 0–99)
TRIGLYCERIDES: 153 mg/dL — AB (ref <150)
Total CHOL/HDL Ratio: 4.2 Ratio
VLDL: 31 mg/dL (ref 0–40)

## 2013-12-21 LAB — TSH: TSH: 1.318 u[IU]/mL (ref 0.350–4.500)

## 2014-07-10 ENCOUNTER — Other Ambulatory Visit: Payer: Self-pay | Admitting: Obstetrics & Gynecology

## 2014-07-10 DIAGNOSIS — Z1231 Encounter for screening mammogram for malignant neoplasm of breast: Secondary | ICD-10-CM

## 2014-08-03 ENCOUNTER — Ambulatory Visit (HOSPITAL_COMMUNITY)
Admission: RE | Admit: 2014-08-03 | Discharge: 2014-08-03 | Disposition: A | Discharge status: Home / Self Care | Payer: BLUE CROSS/BLUE SHIELD | Source: Ambulatory Visit | Attending: Obstetrics & Gynecology | Admitting: Obstetrics & Gynecology

## 2014-08-03 DIAGNOSIS — Z1231 Encounter for screening mammogram for malignant neoplasm of breast: Secondary | ICD-10-CM | POA: Diagnosis present

## 2014-08-06 IMAGING — MG MM DIGITAL SCREENING BILAT
5 series · 5 of 5 positions shown · non-contrast
Comparison: Previous exams.

CLINICAL DATA: Screening.

DIGITAL SCREENING BILATERAL MAMMOGRAM WITH CAD

[L CC]
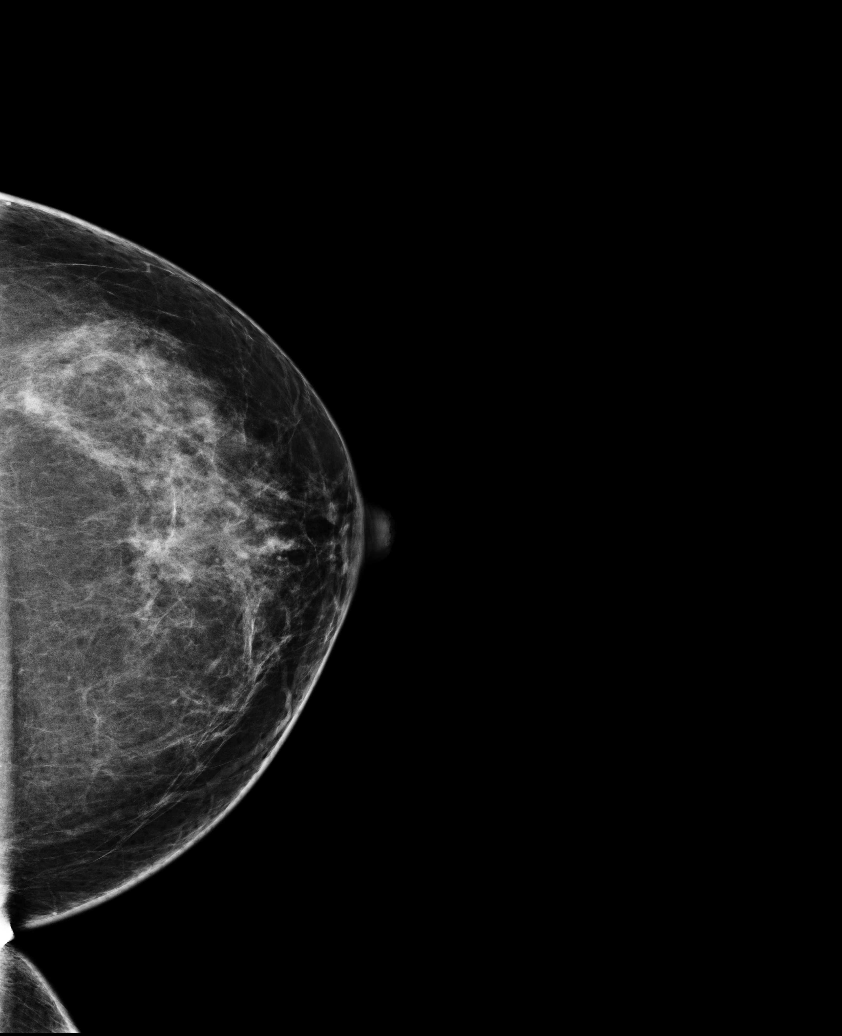

[L MLO (1 of 2)]
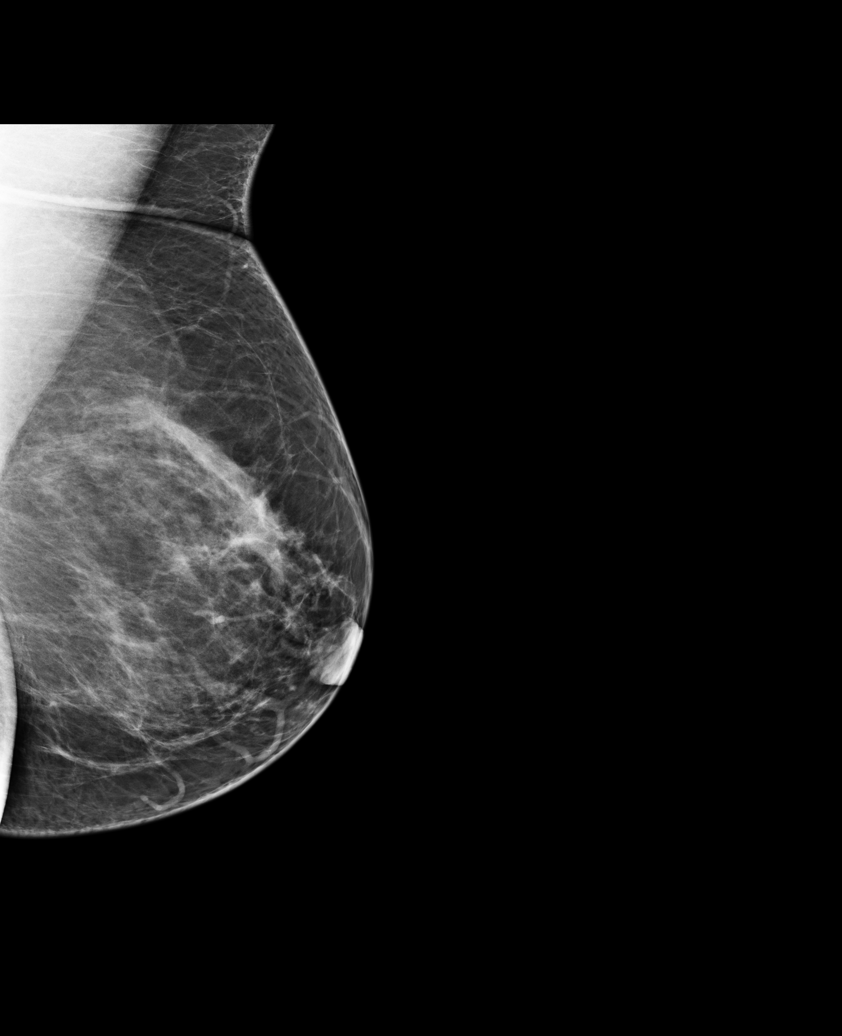

[R CC]
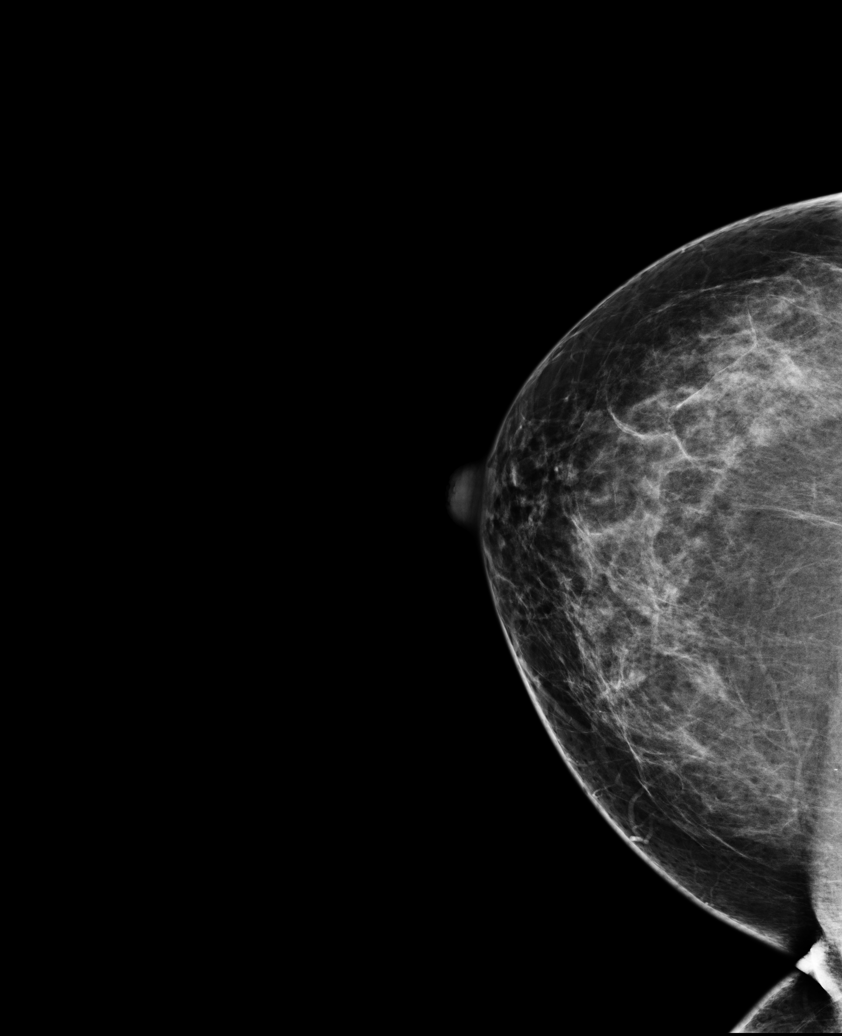

[R MLO]
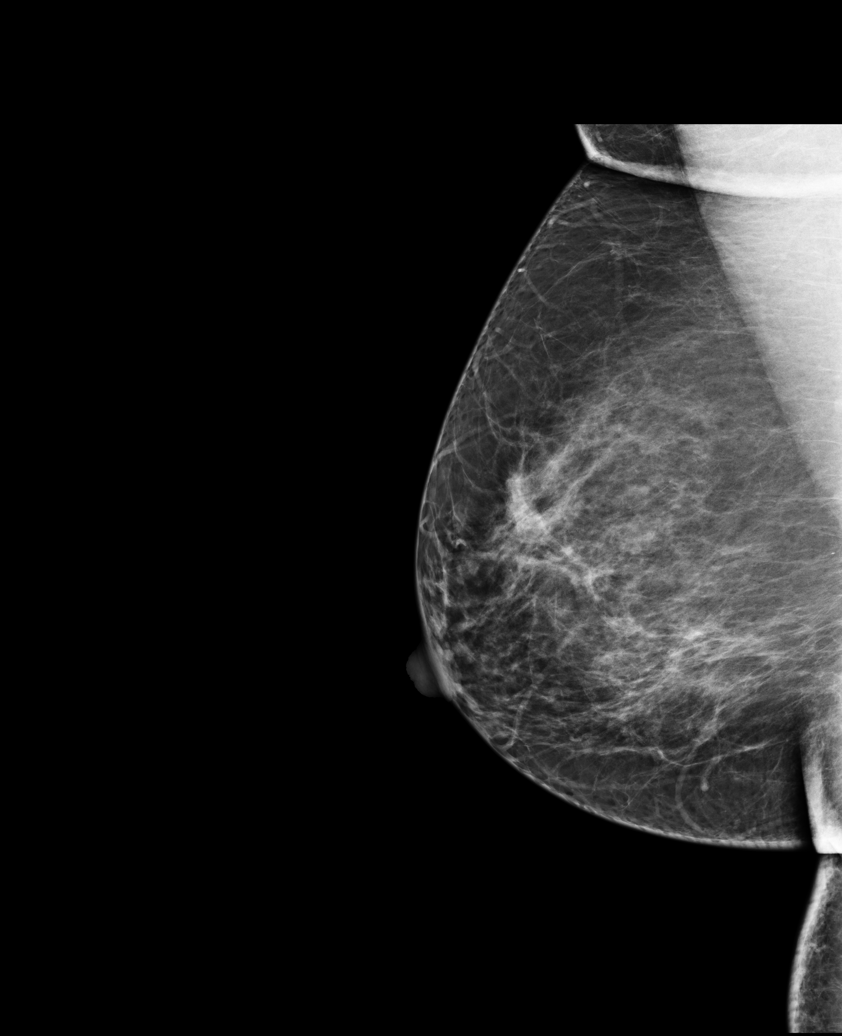

[L MLO (2 of 2)]
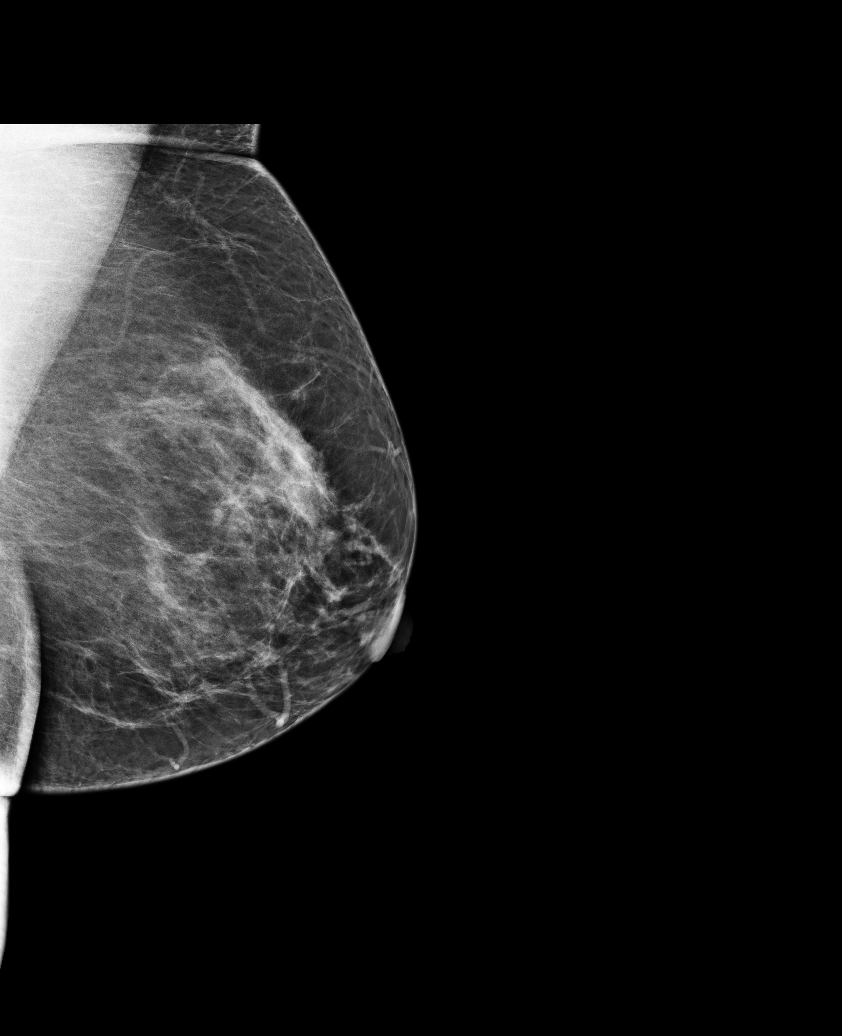

[5 of 5 positions shown; findings below may reference images not displayed]

FINDINGS: ACR Breast Density Category 2: There is a scattered fibroglandular
pattern.

There are no findings suspicious for malignancy.

Images were processed with CAD.
IMPRESSION: No mammographic evidence of malignancy.

A result letter of this screening mammogram will be mailed directly
to the patient.

RECOMMENDATION:
Screening mammogram in one year. (Code:C0-M-1VF)

BI-RADS CATEGORY 1:  Negative.

## 2015-05-28 ENCOUNTER — Ambulatory Visit (HOSPITAL_COMMUNITY)
Admission: RE | Admit: 2015-05-28 | Discharge: 2015-05-28 | Disposition: A | Discharge status: Home / Self Care | Payer: BLUE CROSS/BLUE SHIELD | Source: Ambulatory Visit | Attending: Internal Medicine | Admitting: Internal Medicine

## 2015-05-28 ENCOUNTER — Other Ambulatory Visit (HOSPITAL_COMMUNITY): Payer: Self-pay | Admitting: Internal Medicine

## 2015-05-28 DIAGNOSIS — R0602 Shortness of breath: Secondary | ICD-10-CM

## 2015-05-31 ENCOUNTER — Emergency Department (HOSPITAL_COMMUNITY)
Admission: EM | Admit: 2015-05-31 | Discharge: 2015-05-31 | Disposition: A | Discharge status: Home / Self Care | Payer: BLUE CROSS/BLUE SHIELD | Attending: Emergency Medicine | Admitting: Emergency Medicine

## 2015-05-31 ENCOUNTER — Encounter (HOSPITAL_COMMUNITY): Payer: Self-pay | Admitting: Emergency Medicine

## 2015-05-31 DIAGNOSIS — Z87891 Personal history of nicotine dependence: Secondary | ICD-10-CM | POA: Insufficient documentation

## 2015-05-31 DIAGNOSIS — W290XXA Contact with powered kitchen appliance, initial encounter: Secondary | ICD-10-CM | POA: Diagnosis not present

## 2015-05-31 DIAGNOSIS — E119 Type 2 diabetes mellitus without complications: Secondary | ICD-10-CM | POA: Insufficient documentation

## 2015-05-31 DIAGNOSIS — Y9389 Activity, other specified: Secondary | ICD-10-CM | POA: Diagnosis not present

## 2015-05-31 DIAGNOSIS — Y999 Unspecified external cause status: Secondary | ICD-10-CM | POA: Diagnosis not present

## 2015-05-31 DIAGNOSIS — E782 Mixed hyperlipidemia: Secondary | ICD-10-CM | POA: Diagnosis not present

## 2015-05-31 DIAGNOSIS — S61011A Laceration without foreign body of right thumb without damage to nail, initial encounter: Secondary | ICD-10-CM | POA: Insufficient documentation

## 2015-05-31 DIAGNOSIS — Z23 Encounter for immunization: Secondary | ICD-10-CM | POA: Diagnosis not present

## 2015-05-31 DIAGNOSIS — Y929 Unspecified place or not applicable: Secondary | ICD-10-CM | POA: Insufficient documentation

## 2015-05-31 DIAGNOSIS — Z79899 Other long term (current) drug therapy: Secondary | ICD-10-CM | POA: Diagnosis not present

## 2015-05-31 DIAGNOSIS — I10 Essential (primary) hypertension: Secondary | ICD-10-CM | POA: Insufficient documentation

## 2015-05-31 MED ORDER — TETANUS-DIPHTH-ACELL PERTUSSIS 5-2.5-18.5 LF-MCG/0.5 IM SUSP
0.5000 mL | Freq: Once | INTRAMUSCULAR | Status: AC
  Administered 2015-05-31: 0.5 mL via INTRAMUSCULAR
  Filled 2015-05-31: qty 0.5

## 2015-05-31 MED ORDER — LIDOCAINE HCL (PF) 1 % IJ SOLN
5.0000 mL | Freq: Once | INTRAMUSCULAR | Status: AC
  Administered 2015-05-31: 5 mL
  Filled 2015-05-31: qty 5

## 2015-05-31 NOTE — ED Notes (Signed)
Pt reports a laceration to RT thumb. States she cut it while cleaning her blender. Bleeding controlled with pressure.

## 2015-05-31 NOTE — Discharge Instructions (Signed)
Please keep your wound clean and dry. Please have sutures removed in 7 or 8 days. Please see your primary physician, or return to the emergency department if any pus like drainage, red streaks going up the hand, high fever, or signs of advancing infection. Stitches, Staples, or Adhesive Wound Closure Health care providers use stitches (sutures), staples, and certain glue (skin adhesives) to hold skin together while it heals (wound closure). You may need this treatment after you have surgery or if you cut your skin accidentally. These methods help your skin to heal more quickly and make it less likely that you will have a scar. A wound may take several months to heal completely. The type of wound you have determines when your wound gets closed. In most cases, the wound is closed as soon as possible (primary skin closure). Sometimes, closure is delayed so the wound can be cleaned and allowed to heal naturally. This reduces the chance of infection. Delayed closure may be needed if your wound:  Is caused by a bite.  Happened more than 6 hours ago.  Involves loss of skin or the tissues under the skin.  Has dirt or debris in it that cannot be removed.  Is infected. WHAT ARE THE DIFFERENT KINDS OF WOUND CLOSURES? There are many options for wound closure. The one that your health care provider uses depends on how deep and how large your wound is. Adhesive Glue To use this type of glue to close a wound, your health care provider holds the edges of the wound together and paints the glue on the surface of your skin. You may need more than one layer of glue. Then the wound may be covered with a light bandage (dressing). This type of skin closure may be used for small wounds that are not deep (superficial). Using glue for wound closure is less painful than other methods. It does not require a medicine that numbs the area (local anesthetic). This method also leaves nothing to be removed. Adhesive glue is often  used for children and on facial wounds. Adhesive glue cannot be used for wounds that are deep, uneven, or bleeding. It is not used inside of a wound.  Adhesive Strips These strips are made of sticky (adhesive), porous paper. They are applied across your skin edges like a regular adhesive bandage. You leave them on until they fall off. Adhesive strips may be used to close very superficial wounds. They may also be used along with sutures to improve the closure of your skin edges.  Sutures Sutures are the oldest method of wound closure. Sutures can be made from natural substances, such as silk, or from synthetic materials, such as nylon and steel. They can be made from a material that your body can break down as your wound heals (absorbable), or they can be made from a material that needs to be removed from your skin (nonabsorbable). They come in many different strengths and sizes. Your health care provider attaches the sutures to a steel needle on one end. Sutures can be passed through your skin, or through the tissues beneath your skin. Then they are tied and cut. Your skin edges may be closed in one continuous stitch or in separate stitches. Sutures are strong and can be used for all kinds of wounds. Absorbable sutures may be used to close tissues under the skin. The disadvantage of sutures is that they may cause skin reactions that lead to infection. Nonabsorbable sutures need to be removed. Staples When  surgical staples are used to close a wound, the edges of your skin on both sides of the wound are brought close together. A staple is placed across the wound, and an instrument secures the edges together. Staples are often used to close surgical cuts (incisions). Staples are faster to use than sutures, and they cause less skin reaction. Staples need to be removed using a tool that bends the staples away from your skin. HOW DO I CARE FOR MY WOUND CLOSURE?  Take medicines only as directed by your health  care provider.  If you were prescribed an antibiotic medicine for your wound, finish it all even if you start to feel better.  Use ointments or creams only as directed by your health care provider.  Wash your hands with soap and water before and after touching your wound.  Do not soak your wound in water. Do not take baths, swim, or use a hot tub until your health care provider approves.  Ask your health care provider when you can start showering. Cover your wound if directed by your health care provider.  Do not take out your own sutures or staples.  Do not pick at your wound. Picking can cause an infection.  Keep all follow-up visits as directed by your health care provider. This is important. HOW LONG WILL I HAVE MY WOUND CLOSURE?  Leave adhesive glue on your skin until the glue peels away.  Leave adhesive strips on your skin until the strips fall off.  Absorbable sutures will dissolve within several days.  Nonabsorbable sutures and staples must be removed. The location of the wound will determine how long they stay in. This can range from several days to a couple of weeks. WHEN SHOULD I SEEK HELP FOR MY WOUND CLOSURE? Contact your health care provider if:  You have a fever.  You have chills.  You have drainage, redness, swelling, or pain at your wound.  There is a bad smell coming from your wound.  The skin edges of your wound start to separate after your sutures have been removed.  Your wound becomes thick, raised, and darker in color after your sutures come out (scarring).   This information is not intended to replace advice given to you by your health care provider. Make sure you discuss any questions you have with your health care provider.   Document Released: 11/05/2000 Document Revised: 03/03/2014 Document Reviewed: 07/20/2013 Elsevier Interactive Patient Education [employer].

## 2015-05-31 NOTE — ED Provider Notes (Signed)
CSN: [id number]     Arrival date & time 05/31/15  1715 History   First MD Initiated Contact with Patient 05/31/15 1732     Chief Complaint  Patient presents with  . Extremity Laceration     (Consider location/radiation/quality/duration/timing/severity/associated sxs/prior Treatment) HPI Comments: Patient is a 55 year old female who presents to the emergency department with laceration to the right thumb. The patient states she was cleaning a blender, when she struck her thumb and sustained a laceration. She has been able to control the bleeding with pressure. The patient denies being on any anticoagulation medications. It is of note that she has had a previous injury to the hand, and has used only of the thumb and index finger on the right hand, but no other injury reported from today's event.  The history is provided by the patient.    Past Medical History  Diagnosis Date  . History of seasonal allergies   . Hypercholesteremia   . Diabetes mellitus without complication (HCC)     no medications  . Hypertension     norvasc   Past Surgical History  Procedure Laterality Date  . Tubal ligation    . Right hand sugery    . Dilation and curettage of uterus    . Colonoscopy  02/05/2011    RMR:4 mm polyp in the ascending/segment (tubular adenoma) ; otherwise normal colon. multiple diminutive hyperplastic. next TCS 01/2018   Family History  Problem Relation Age of Onset  . Colon cancer Neg Hx   . Lung cancer Mother   . Hypertension Mother   . Lung cancer Father   . Hypertension Father   . Hypertension Sister   . Hypertension Brother   . Hypertension Sister   . Hypertension Brother   . Hypertension Brother   . Stroke Brother    Social History  Substance Use Topics  . Smoking status: Former Smoker -- 1.00 packs/day for 23 years  . Smokeless tobacco: Never Used     Comment: quit 2002  . Alcohol Use: No   OB History    No data available     Review of Systems  Constitutional:  Negative for activity change.       All ROS Neg except as noted in HPI  HENT: Negative for nosebleeds.   Eyes: Negative for photophobia and discharge.  Respiratory: Negative for cough, shortness of breath and wheezing.   Cardiovascular: Negative for chest pain and palpitations.  Gastrointestinal: Negative for abdominal pain and blood in stool.  Genitourinary: Negative for dysuria, frequency and hematuria.  Musculoskeletal: Negative for back pain, arthralgias and neck pain.  Skin: Negative.   Neurological: Negative for dizziness, seizures and speech difficulty.  Psychiatric/Behavioral: Negative for hallucinations and confusion.      Allergies  Review of patient's allergies indicates no known allergies.  Home Medications   Prior to Admission medications   Medication Sig Start Date End Date Taking? Authorizing Provider  amLODipine (NORVASC) 2.5 MG tablet Take 2.5 mg by mouth daily.    Historical Provider, MD  cetirizine (ZYRTEC) 10 MG tablet Take 10 mg by mouth daily.    Historical Provider, MD  esomeprazole (NEXIUM) 40 MG capsule Take 1 capsule (40 mg total) by mouth daily before breakfast. 04/27/13   Mahala Menghini, PA-C  fluconazole (DIFLUCAN) 150 MG tablet 1 po stat; repeat in 3 days 12/13/13   Christin Fudge, CNM  fluticasone (FLONASE) 50 MCG/ACT nasal spray Place 2 sprays into the nose daily as needed for allergies.  Historical Provider, MD  ibuprofen (ADVIL,MOTRIN) 200 MG tablet Take 400 mg by mouth every 8 (eight) hours as needed for mild pain.     Historical Provider, MD   BP 144/82 mmHg  Pulse 102  Temp(Src) 98.7 F (37.1 C) (Oral)  Resp 18  Ht 5\' 4"  (1.626 m)  Wt 107.956 kg  BMI 40.83 kg/m2  SpO2 95% Physical Exam  Constitutional: She is oriented to person, place, and time. She appears well-developed and well-nourished.  Non-toxic appearance.  HENT:  Head: Normocephalic.  Right Ear: Tympanic membrane and external ear normal.  Left Ear: Tympanic membrane  and external ear normal.  Eyes: EOM and lids are normal. Pupils are equal, round, and reactive to light.  Neck: Normal range of motion. Neck supple. Carotid bruit is not present.  Cardiovascular: Normal rate, regular rhythm, normal heart sounds, intact distal pulses and normal pulses.   Pulmonary/Chest: Breath sounds normal. No respiratory distress.  Abdominal: Soft. Bowel sounds are normal. There is no tenderness. There is no guarding.  Musculoskeletal: Normal range of motion.       Hands: 1.4cm laceration thumb No bone or tendon involvement.  Lymphadenopathy:       Head (right side): No submandibular adenopathy present.       Head (left side): No submandibular adenopathy present.    She has no cervical adenopathy.  Neurological: She is alert and oriented to person, place, and time. She has normal strength. No cranial nerve deficit or sensory deficit.  Skin: Skin is warm and dry.  Psychiatric: She has a normal mood and affect. Her speech is normal.  Nursing note and vitals reviewed.   ED Course  .Marland KitchenLaceration Repair Date/Time: 05/31/2015 6:26 PM Performed by: Lily Kocher Authorized by: Lily Kocher Consent: Verbal consent obtained. Risks and benefits: risks, benefits and alternatives were discussed Consent given by: patient Patient understanding: patient states understanding of the procedure being performed Patient identity confirmed: arm band Time out: Immediately prior to procedure a "time out" was called to verify the correct patient, procedure, equipment, support staff and site/side marked as required. Body area: upper extremity Location details: right thumb Laceration length: 1.4 cm Foreign bodies: no foreign bodies Tendon involvement: none Anesthesia: digital block Local anesthetic: lidocaine 1% without epinephrine Patient sedated: no Preparation: Patient was prepped and draped in the usual sterile fashion. Irrigation solution: saline Amount of cleaning:  standard Skin closure: 4-0 nylon Number of sutures: 6 Technique: simple Approximation: close Approximation difficulty: simple Dressing: gauze roll Patient tolerance: Patient tolerated the procedure well with no immediate complications   (including critical care time) Labs Review Labs Reviewed - No data to display  Imaging Review No results found. I have personally reviewed and evaluated these images and lab results as part of my medical decision-making.   EKG Interpretation None      MDM Patient sustained a laceration to the right thumb while cleaning her blender. The wound was repaired with 6 interrupted sutures of 4-0 nylon. The patient tolerated the procedure without problem. The patient will have the sutures removed in 7 days. We discuss need to return in case of signs of infection. Tetanus status updated    Final diagnoses:  None    **I have reviewed nursing notes, vital signs, and all appropriate lab and imaging results for this patient.Lily Kocher, PA-C 05/31/15 1828  Forde Dandy, MD 06/01/15 1357

## 2015-07-30 ENCOUNTER — Other Ambulatory Visit (HOSPITAL_COMMUNITY): Payer: Self-pay | Admitting: Internal Medicine

## 2015-07-30 DIAGNOSIS — Z1231 Encounter for screening mammogram for malignant neoplasm of breast: Secondary | ICD-10-CM

## 2015-08-06 ENCOUNTER — Ambulatory Visit (HOSPITAL_COMMUNITY)
Admission: RE | Admit: 2015-08-06 | Discharge: 2015-08-06 | Disposition: A | Discharge status: Home / Self Care | Payer: BLUE CROSS/BLUE SHIELD | Source: Ambulatory Visit | Attending: Internal Medicine | Admitting: Internal Medicine

## 2015-08-06 DIAGNOSIS — Z1231 Encounter for screening mammogram for malignant neoplasm of breast: Secondary | ICD-10-CM | POA: Diagnosis not present

## 2015-08-06 DIAGNOSIS — R928 Other abnormal and inconclusive findings on diagnostic imaging of breast: Secondary | ICD-10-CM | POA: Insufficient documentation

## 2015-08-09 ENCOUNTER — Other Ambulatory Visit: Payer: Self-pay | Admitting: Internal Medicine

## 2015-08-09 DIAGNOSIS — R928 Other abnormal and inconclusive findings on diagnostic imaging of breast: Secondary | ICD-10-CM

## 2015-08-22 ENCOUNTER — Ambulatory Visit (HOSPITAL_COMMUNITY)
Admission: RE | Admit: 2015-08-22 | Discharge: 2015-08-22 | Disposition: A | Discharge status: Home / Self Care | Payer: BLUE CROSS/BLUE SHIELD | Source: Ambulatory Visit | Attending: Internal Medicine | Admitting: Internal Medicine

## 2015-08-22 DIAGNOSIS — N63 Unspecified lump in breast: Secondary | ICD-10-CM | POA: Diagnosis not present

## 2015-08-22 DIAGNOSIS — R928 Other abnormal and inconclusive findings on diagnostic imaging of breast: Secondary | ICD-10-CM

## 2015-09-04 ENCOUNTER — Encounter (HOSPITAL_COMMUNITY): Payer: BLUE CROSS/BLUE SHIELD

## 2016-07-16 ENCOUNTER — Other Ambulatory Visit (HOSPITAL_COMMUNITY): Payer: Self-pay | Admitting: Internal Medicine

## 2016-07-16 DIAGNOSIS — Z1231 Encounter for screening mammogram for malignant neoplasm of breast: Secondary | ICD-10-CM

## 2016-08-25 ENCOUNTER — Ambulatory Visit (HOSPITAL_COMMUNITY): Payer: BLUE CROSS/BLUE SHIELD

## 2016-09-04 ENCOUNTER — Ambulatory Visit (HOSPITAL_COMMUNITY): Payer: BLUE CROSS/BLUE SHIELD

## 2016-09-08 ENCOUNTER — Ambulatory Visit (HOSPITAL_COMMUNITY)
Admission: RE | Admit: 2016-09-08 | Discharge: 2016-09-08 | Disposition: A | Discharge status: Home / Self Care | Payer: BLUE CROSS/BLUE SHIELD | Source: Ambulatory Visit | Attending: Internal Medicine | Admitting: Internal Medicine

## 2016-09-08 DIAGNOSIS — Z1231 Encounter for screening mammogram for malignant neoplasm of breast: Secondary | ICD-10-CM | POA: Insufficient documentation

## 2017-06-03 IMAGING — DX DG CHEST 2V
2 series · 2 of 2 positions shown · non-contrast
Comparison: 04/22/2012

CLINICAL DATA: Short of breath intermittently for the past few
months.

EXAM:
CHEST  2 VIEW

[chest pa]
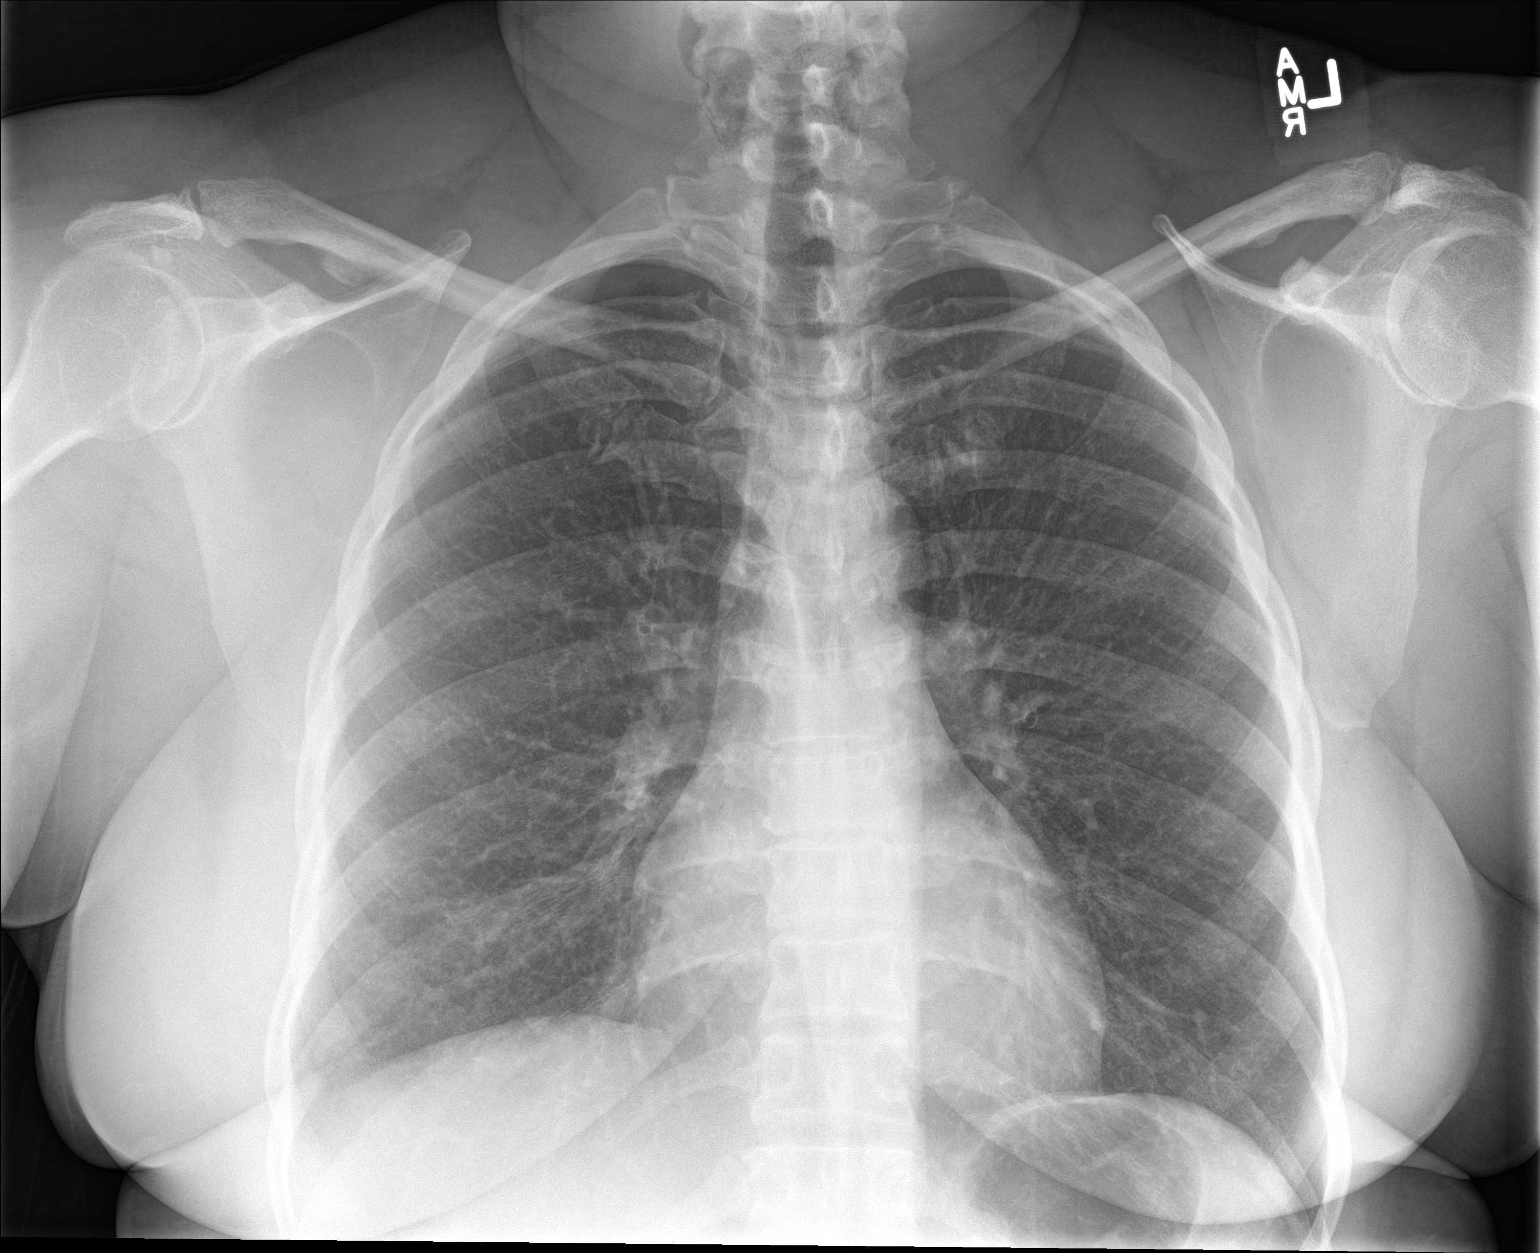

[chest lat]
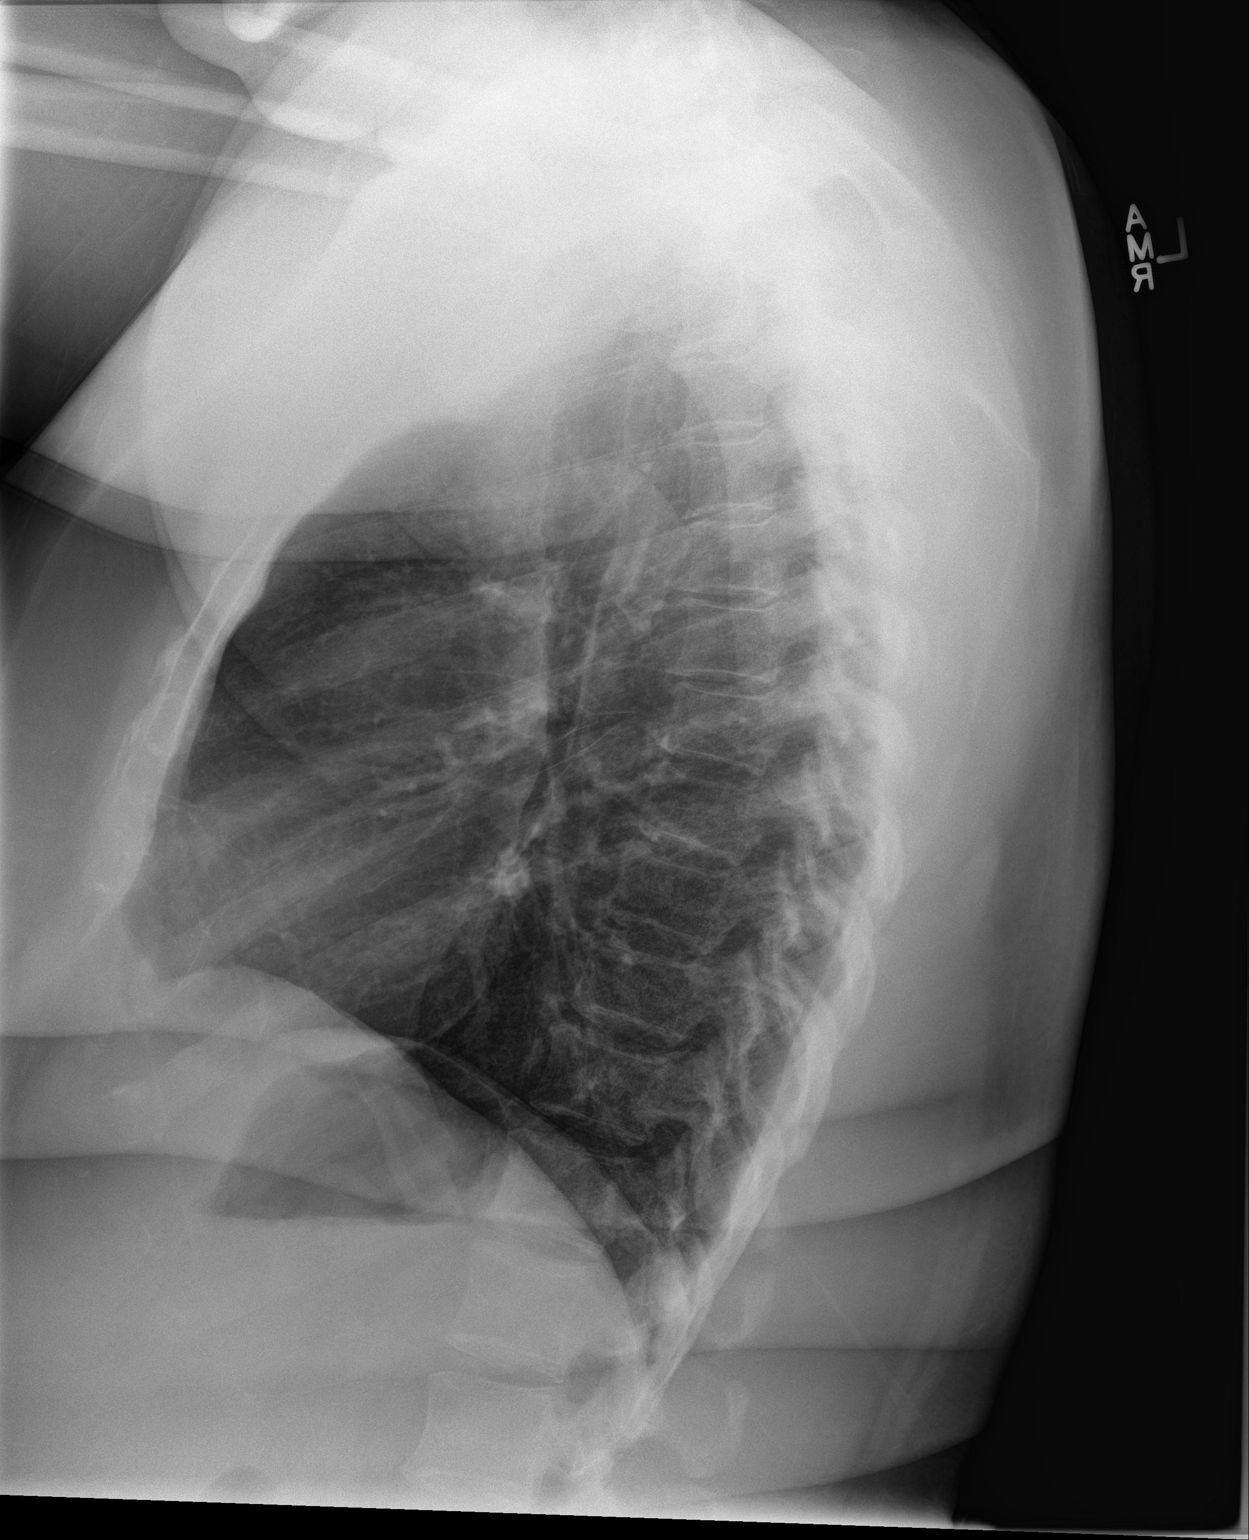

[2 of 2 positions shown; findings below may reference images not displayed]

FINDINGS: Midline trachea.  Normal heart size and mediastinal contours.

Sharp costophrenic angles.  No pneumothorax.  Clear lungs.
IMPRESSION: No active cardiopulmonary disease.

## 2017-08-11 ENCOUNTER — Other Ambulatory Visit (HOSPITAL_COMMUNITY): Payer: Self-pay | Admitting: Internal Medicine

## 2017-08-11 DIAGNOSIS — Z1231 Encounter for screening mammogram for malignant neoplasm of breast: Secondary | ICD-10-CM

## 2017-09-10 ENCOUNTER — Ambulatory Visit (HOSPITAL_COMMUNITY)
Admission: RE | Admit: 2017-09-10 | Discharge: 2017-09-10 | Disposition: A | Discharge status: Home / Self Care | Payer: BLUE CROSS/BLUE SHIELD | Source: Ambulatory Visit | Attending: Internal Medicine | Admitting: Internal Medicine

## 2017-09-10 DIAGNOSIS — Z1231 Encounter for screening mammogram for malignant neoplasm of breast: Secondary | ICD-10-CM | POA: Diagnosis not present

## 2017-12-23 ENCOUNTER — Encounter: Payer: Self-pay | Admitting: Internal Medicine

## 2018-08-02 ENCOUNTER — Other Ambulatory Visit (HOSPITAL_COMMUNITY): Payer: Self-pay | Admitting: Internal Medicine

## 2018-08-02 DIAGNOSIS — Z1231 Encounter for screening mammogram for malignant neoplasm of breast: Secondary | ICD-10-CM

## 2018-09-23 ENCOUNTER — Ambulatory Visit (HOSPITAL_COMMUNITY): Payer: BLUE CROSS/BLUE SHIELD

## 2018-10-01 ENCOUNTER — Other Ambulatory Visit: Payer: Self-pay

## 2018-10-01 ENCOUNTER — Ambulatory Visit (HOSPITAL_COMMUNITY)
Admission: RE | Admit: 2018-10-01 | Discharge: 2018-10-01 | Disposition: A | Discharge status: Home / Self Care | Payer: BC Managed Care – PPO | Source: Ambulatory Visit | Attending: Internal Medicine | Admitting: Internal Medicine

## 2018-10-01 DIAGNOSIS — Z1231 Encounter for screening mammogram for malignant neoplasm of breast: Secondary | ICD-10-CM | POA: Insufficient documentation

## 2018-10-14 ENCOUNTER — Other Ambulatory Visit: Payer: Self-pay

## 2018-10-14 DIAGNOSIS — Z20822 Contact with and (suspected) exposure to covid-19: Secondary | ICD-10-CM

## 2018-10-15 LAB — NOVEL CORONAVIRUS, NAA: SARS-CoV-2, NAA: NOT DETECTED

## 2018-11-23 ENCOUNTER — Other Ambulatory Visit: Payer: Self-pay

## 2018-11-23 DIAGNOSIS — Z20822 Contact with and (suspected) exposure to covid-19: Secondary | ICD-10-CM

## 2018-11-25 LAB — NOVEL CORONAVIRUS, NAA: SARS-CoV-2, NAA: NOT DETECTED

## 2019-09-29 ENCOUNTER — Other Ambulatory Visit (HOSPITAL_COMMUNITY): Payer: Self-pay | Admitting: Internal Medicine

## 2019-09-29 DIAGNOSIS — Z1231 Encounter for screening mammogram for malignant neoplasm of breast: Secondary | ICD-10-CM

## 2019-10-14 ENCOUNTER — Ambulatory Visit (HOSPITAL_COMMUNITY): Payer: BC Managed Care – PPO

## 2019-10-17 ENCOUNTER — Other Ambulatory Visit: Payer: Self-pay

## 2019-10-17 ENCOUNTER — Ambulatory Visit (HOSPITAL_COMMUNITY)
Admission: RE | Admit: 2019-10-17 | Discharge: 2019-10-17 | Disposition: A | Discharge status: Home / Self Care | Payer: BC Managed Care – PPO | Source: Ambulatory Visit | Attending: Internal Medicine | Admitting: Internal Medicine

## 2019-10-17 DIAGNOSIS — Z1231 Encounter for screening mammogram for malignant neoplasm of breast: Secondary | ICD-10-CM | POA: Insufficient documentation

## 2019-10-26 ENCOUNTER — Other Ambulatory Visit (HOSPITAL_COMMUNITY): Payer: Self-pay | Admitting: Internal Medicine

## 2019-10-26 DIAGNOSIS — R928 Other abnormal and inconclusive findings on diagnostic imaging of breast: Secondary | ICD-10-CM

## 2019-11-15 ENCOUNTER — Ambulatory Visit (HOSPITAL_COMMUNITY)
Admission: RE | Admit: 2019-11-15 | Discharge: 2019-11-15 | Disposition: A | Discharge status: Home / Self Care | Payer: BC Managed Care – PPO | Source: Ambulatory Visit | Attending: Internal Medicine | Admitting: Internal Medicine

## 2019-11-15 ENCOUNTER — Other Ambulatory Visit: Payer: Self-pay

## 2019-11-15 DIAGNOSIS — R928 Other abnormal and inconclusive findings on diagnostic imaging of breast: Secondary | ICD-10-CM | POA: Diagnosis present

## 2020-08-16 ENCOUNTER — Other Ambulatory Visit: Payer: Self-pay

## 2020-08-16 ENCOUNTER — Encounter: Payer: Self-pay | Admitting: Emergency Medicine

## 2020-08-16 ENCOUNTER — Ambulatory Visit
Admission: EM | Admit: 2020-08-16 | Discharge: 2020-08-16 | Disposition: A | Discharge status: Home / Self Care | Payer: BC Managed Care – PPO | Attending: Emergency Medicine | Admitting: Emergency Medicine

## 2020-08-16 DIAGNOSIS — H9202 Otalgia, left ear: Secondary | ICD-10-CM

## 2020-08-16 DIAGNOSIS — R42 Dizziness and giddiness: Secondary | ICD-10-CM

## 2020-08-16 DIAGNOSIS — H8110 Benign paroxysmal vertigo, unspecified ear: Secondary | ICD-10-CM

## 2020-08-16 MED ORDER — PREDNISONE 20 MG PO TABS: 20.0000 mg | ORAL_TABLET | Freq: Two times a day (BID) | ORAL | 0 refills | Status: AC

## 2020-08-16 MED ORDER — ONDANSETRON HCL 4 MG PO TABS: 4.0000 mg | ORAL_TABLET | Freq: Four times a day (QID) | ORAL | 0 refills | Status: DC

## 2020-08-16 MED ORDER — MECLIZINE HCL 12.5 MG PO TABS: 12.5000 mg | ORAL_TABLET | Freq: Three times a day (TID) | ORAL | 0 refills | Status: DC | PRN

## 2020-08-16 MED ORDER — PREDNISONE 20 MG PO TABS: 20.0000 mg | ORAL_TABLET | Freq: Two times a day (BID) | ORAL | 0 refills | Status: DC

## 2020-08-16 NOTE — ED Triage Notes (Signed)
Triaged by provider  

## 2020-08-16 NOTE — Discharge Instructions (Addendum)
Prednisone for congestion/ swelling Meclizine prescribed.  Take as directed for symptomatic relief.  This medication may make you drowsy so use with cautions while driving or operating heavy machinery. Zofran prescribed.  Take as needed for nausea Follow up with PCP if symptoms persists Call 911 or go to the ER if you have any new or worsening symptoms fever, chills, persistent vomiting, slurred speech, facial droop, weakness, abdominal pain, chest pain, shortness of breath, etc..Marland Kitchen

## 2020-08-16 NOTE — ED Provider Notes (Signed)
[facility]   [id number] 08/16/20 Arrival Time: 1037  [id number]  SUBJECTIVE:  Cassandra Ballard is a 60 y.o. female who presents with complaint of intermittent dizziness that began 2 days ago.  Denies a precipitating event, trauma, or recent URI within the past month.  Describes the dizziness as "being on a boat." States that it is intermittent.  Has tried OTC allergy medication without relief.  Symptoms made worse with position changes.  Denies previous symptoms.  Complains of associated LT ear discomfort, nausea, and vomiting.  Denies fever, chills, hearing changes, tinnitus, ear pain, chest pain, syncope, SOB, weakness, slurred speech, memory or emotional changes, facial drooping/ asymmetry, incoordination, numbness or tingling, abdominal pain, changes in bowel or bladder habits.    ROS: As per HPI.  All other pertinent ROS negative.    Past Medical History:  Diagnosis Date   Diabetes mellitus without complication ([place])    no medications   History of seasonal allergies    Hypercholesteremia    Hypertension    norvasc   Past Surgical History:  Procedure Laterality Date   COLONOSCOPY  02/05/2011   RMR:4 mm polyp in the ascending/segment (tubular adenoma) ; otherwise normal colon. multiple diminutive hyperplastic. next TCS 01/2018   DILATION AND CURETTAGE OF UTERUS     right hand sugery     TUBAL LIGATION     No Known Allergies No current facility-administered medications on file prior to encounter.   Current Outpatient Medications on File Prior to Encounter  Medication Sig Dispense Refill   amLODipine (NORVASC) 2.5 MG tablet Take 2.5 mg by mouth daily.     cetirizine (ZYRTEC) 10 MG tablet Take 10 mg by mouth daily.     esomeprazole (NEXIUM) 40 MG capsule Take 1 capsule (40 mg total) by mouth daily before breakfast. 30 capsule 11   fluconazole (DIFLUCAN) 150 MG tablet 1 po stat; repeat in 3 days 2 tablet 2   fluticasone (FLONASE) 50 MCG/ACT nasal spray Place 2 sprays  into the nose daily as needed for allergies.      ibuprofen (ADVIL,MOTRIN) 200 MG tablet Take 400 mg by mouth every 8 (eight) hours as needed for mild pain.      Social History   Socioeconomic History   Marital status: Married    Spouse name: Not on file   Number of children: 2   Years of education: Not on file   Highest education level: Not on file  Occupational History    Employer: UNEMPLOYED  Tobacco Use   Smoking status: Former    Packs/day: 1.00    Years: 23.00    Pack years: 23.00    Types: Cigarettes   Smokeless tobacco: Never   Tobacco comments:    quit 2002  Substance and Sexual Activity   Alcohol use: No   Drug use: No   Sexual activity: Yes    Birth control/protection: Surgical  Other Topics Concern   Not on file  Social History Narrative   Not on file   Social Determinants of Health   Financial Resource Strain: Not on file  Food Insecurity: Not on file  Transportation Needs: Not on file  Physical Activity: Not on file  Stress: Not on file  Social Connections: Not on file  Intimate Partner Violence: Not on file   Family History  Problem Relation Age of Onset   Colon cancer Neg Hx    Lung cancer Mother    Hypertension Mother    Lung cancer Father  Hypertension Father    Hypertension Sister    Hypertension Brother    Hypertension Sister    Hypertension Brother    Hypertension Brother    Stroke Brother     OBJECTIVE:  Vitals:   08/16/20 1054  BP: 128/79  Pulse: 88  Resp: 20  Temp: 98 F (36.7 C)  TempSrc: Tympanic  SpO2: 96%    General appearance: alert; no distress Eyes: PERRLA; EOMI; conjunctiva normal HENT: normocephalic; atraumatic; TMs normal; nasal mucosa normal; oral mucosa normal Neck: supple with FROM Lungs: clear to auscultation bilaterally Heart: regular rate and rhythm Extremities: no cyanosis or edema; symmetrical with no gross deformities Skin: warm and dry Neurologic: normal gait; CN 2-12 grossly  intact Psychological: alert and cooperative; normal mood and affect   ASSESSMENT & PLAN:  1. Left ear pain   2. Dizziness and giddiness   3. Benign paroxysmal positional vertigo, unspecified laterality     Meds ordered this encounter  Medications   predniSONE (DELTASONE) 20 MG tablet    Sig: Take 1 tablet (20 mg total) by mouth 2 (two) times daily with a meal for 5 days.    Dispense:  10 tablet    Refill:  0    Order Specific Question:   Supervising Provider    Answer:   Raylene Everts [[id number]]   meclizine (ANTIVERT) 12.5 MG tablet    Sig: Take 1 tablet (12.5 mg total) by mouth 3 (three) times daily as needed for dizziness.    Dispense:  30 tablet    Refill:  0    Order Specific Question:   Supervising Provider    Answer:   Raylene Everts [[id number]]   ondansetron (ZOFRAN) 4 MG tablet    Sig: Take 1 tablet (4 mg total) by mouth every 6 (six) hours.    Dispense:  12 tablet    Refill:  0    Order Specific Question:   Supervising Provider    Answer:   Raylene Everts [[id number]]   Prednisone for congestion/ swelling Meclizine prescribed.  Take as directed for symptomatic relief.  This medication may make you drowsy so use with cautions while driving or operating heavy machinery. Zofran prescribed.  Take as needed for nausea Follow up with PCP if symptoms persists Call 911 or go to the ER if you have any new or worsening symptoms fever, chills, persistent vomiting, slurred speech, facial droop, weakness, abdominal pain, chest pain, shortness of breath, etc...  Reviewed expectations re: course of current medical issues. Questions answered. Outlined signs and symptoms indicating need for more acute intervention. Patient verbalized understanding. After Visit Summary given.     Lestine Box, PA-C 08/16/20 1118

## 2020-10-02 ENCOUNTER — Other Ambulatory Visit (HOSPITAL_COMMUNITY): Payer: Self-pay | Admitting: Internal Medicine

## 2020-10-02 DIAGNOSIS — Z1231 Encounter for screening mammogram for malignant neoplasm of breast: Secondary | ICD-10-CM

## 2020-11-16 ENCOUNTER — Ambulatory Visit (HOSPITAL_COMMUNITY)
Admission: RE | Admit: 2020-11-16 | Discharge: 2020-11-16 | Disposition: A | Discharge status: Home / Self Care | Payer: BC Managed Care – PPO | Source: Ambulatory Visit | Attending: Internal Medicine | Admitting: Internal Medicine

## 2020-11-16 ENCOUNTER — Other Ambulatory Visit: Payer: Self-pay

## 2020-11-16 DIAGNOSIS — Z1231 Encounter for screening mammogram for malignant neoplasm of breast: Secondary | ICD-10-CM | POA: Insufficient documentation

## 2020-12-28 DIAGNOSIS — E119 Type 2 diabetes mellitus without complications: Secondary | ICD-10-CM | POA: Diagnosis not present

## 2020-12-28 DIAGNOSIS — J4520 Mild intermittent asthma, uncomplicated: Secondary | ICD-10-CM | POA: Diagnosis not present

## 2020-12-28 DIAGNOSIS — I10 Essential (primary) hypertension: Secondary | ICD-10-CM | POA: Diagnosis not present

## 2021-01-10 DIAGNOSIS — Z23 Encounter for immunization: Secondary | ICD-10-CM | POA: Diagnosis not present

## 2021-04-05 DIAGNOSIS — E119 Type 2 diabetes mellitus without complications: Secondary | ICD-10-CM | POA: Diagnosis not present

## 2021-04-05 DIAGNOSIS — K219 Gastro-esophageal reflux disease without esophagitis: Secondary | ICD-10-CM | POA: Diagnosis not present

## 2021-04-05 DIAGNOSIS — I10 Essential (primary) hypertension: Secondary | ICD-10-CM | POA: Diagnosis not present

## 2021-06-19 ENCOUNTER — Ambulatory Visit (INDEPENDENT_AMBULATORY_CARE_PROVIDER_SITE_OTHER): Payer: Self-pay | Admitting: *Deleted

## 2021-06-19 ENCOUNTER — Other Ambulatory Visit: Payer: Self-pay | Admitting: *Deleted

## 2021-06-19 ENCOUNTER — Encounter: Payer: Self-pay | Admitting: *Deleted

## 2021-06-19 VITALS — Ht 65.0 in | Wt 218.0 lb

## 2021-06-19 DIAGNOSIS — Z86010 Personal history of colonic polyps: Secondary | ICD-10-CM

## 2021-06-19 MED ORDER — PEG 3350-KCL-NA BICARB-NACL 420 G PO SOLR: 4000.0000 mL | Freq: Once | ORAL | 0 refills | Status: AC

## 2021-06-19 NOTE — Progress Notes (Signed)
Spoke with pt.  Scheduled procedure for 07/17/2021 at 9:00, arrival 7:30 at [facility].  Reviewed prep instructions with pt by phone.  She was made aware of how to adjust her diabetes medication.  She is aware to pick up prep kit at pharmacy and OTC items.  Confirmed mailing address with pt and mailed instructions. ?

## 2021-06-19 NOTE — Progress Notes (Signed)
Appropriate to schedule. ASA 2. ? ?Hold metformin night prior to procedure and the morning of.  ?

## 2021-06-19 NOTE — Addendum Note (Signed)
Addended by: Metro Kung on: 06/19/2021 01:50 PM ? ? Modules accepted: Orders ? ?

## 2021-06-19 NOTE — Progress Notes (Addendum)
Gastroenterology Pre-Procedure Review ? ?Request Date: 06/19/2021 ?Requesting Physician: 7 year recall, Last TCS done 02/05/2011 by Dr. Gala Romney, tubular adenoma ? ?PATIENT REVIEW QUESTIONS: The patient responded to the following health history questions as indicated:   ? ?1. Diabetes Melitis: yes, type II  ?2. Joint replacements in the past 12 months: no ?3. Major health problems in the past 3 months: no ?4. Has an artificial valve or MVP: no ?5. Has a defibrillator: no ?6. Has been advised in past to take antibiotics in advance of a procedure like teeth cleaning: no ?7. Family history of colon cancer: no  ?8. Alcohol Use: no ?9. Illicit drug Use: no ?10. History of sleep apnea: no  ?11. History of coronary artery or other vascular stents placed within the last 12 months: no ?12. History of any prior anesthesia complications: no ?13. Body mass index is 36.28 kg/m?. ?   ?MEDICATIONS & ALLERGIES:    ?Patient reports the following regarding taking any blood thinners:   ?Plavix? no ?Aspirin? no ?Coumadin? no ?Brilinta? no ?Xarelto? no ?Eliquis? no ?Pradaxa? no ?Savaysa? no ?Effient? no ? ?Patient confirms/reports the following medications:  ?Current Outpatient Medications  ?Medication Sig Dispense Refill  ? atorvastatin (LIPITOR) 10 MG tablet Take 10 mg by mouth daily.    ? cetirizine (ZYRTEC) 10 MG tablet Take 10 mg by mouth daily.    ? fluticasone (FLONASE) 50 MCG/ACT nasal spray Place 2 sprays into the nose daily as needed for allergies.     ? ibuprofen (ADVIL,MOTRIN) 200 MG tablet Take 400 mg by mouth as needed for mild pain.    ? lisinopril (ZESTRIL) 5 MG tablet Take 5 mg by mouth daily.    ? metFORMIN (GLUCOPHAGE) 500 MG tablet Take 500 mg by mouth 2 (two) times daily.    ? ondansetron (ZOFRAN) 4 MG tablet Take 1 tablet (4 mg total) by mouth every 6 (six) hours. (Patient taking differently: Take 4 mg by mouth as needed.) 12 tablet 0  ? ?No current facility-administered medications for this visit.  ? ? ?Patient  confirms/reports the following allergies:  ?No Known Allergies ? ?No orders of the defined types were placed in this encounter. ? ? ?AUTHORIZATION INFORMATION ?Primary Insurance: [place],  [place] #: [id number],  Group #: [id number] ?Pre-Cert / Josem Kaufmann required: No, not required per Ria Comment ?Pre-Cert / Auth #: REF#: [id number] ? ?SCHEDULE INFORMATION: ?Procedure has been scheduled as follows:  ?Date: 07/17/2021, Time: 9:00 ?Location: [facility] with Dr. Gala Romney ? ?This Gastroenterology Pre-Precedure Review Form is being routed to the following provider(s): Venetia Night, NP ?  ?

## 2021-06-24 ENCOUNTER — Ambulatory Visit: Payer: BC Managed Care – PPO

## 2021-06-24 ENCOUNTER — Ambulatory Visit: Payer: Self-pay

## 2021-07-15 ENCOUNTER — Telehealth: Payer: Self-pay

## 2021-07-15 NOTE — Telephone Encounter (Signed)
Spoke to pt.  She just wanted to know if Dulcolax and Bisacodyl were the same.  I informed her that they were.

## 2021-07-15 NOTE — Telephone Encounter (Signed)
Pt LMOVM, she has questions about TCS scheduled for 07/17/21.

## 2021-07-17 ENCOUNTER — Encounter (HOSPITAL_COMMUNITY): Payer: Self-pay | Admitting: Internal Medicine

## 2021-07-17 ENCOUNTER — Ambulatory Visit (HOSPITAL_COMMUNITY)
Admission: RE | Admit: 2021-07-17 | Discharge: 2021-07-17 | Disposition: A | Discharge status: Home / Self Care | Payer: BC Managed Care – PPO | Attending: Internal Medicine | Admitting: Internal Medicine

## 2021-07-17 ENCOUNTER — Ambulatory Visit (HOSPITAL_COMMUNITY): Payer: BC Managed Care – PPO | Admitting: Anesthesiology

## 2021-07-17 ENCOUNTER — Other Ambulatory Visit: Payer: Self-pay

## 2021-07-17 ENCOUNTER — Encounter (HOSPITAL_COMMUNITY)
Admission: RE | Disposition: A | Discharge status: Home / Self Care | Payer: Self-pay | Source: Home / Self Care | Attending: Internal Medicine

## 2021-07-17 DIAGNOSIS — Z1211 Encounter for screening for malignant neoplasm of colon: Secondary | ICD-10-CM | POA: Insufficient documentation

## 2021-07-17 DIAGNOSIS — R0789 Other chest pain: Secondary | ICD-10-CM

## 2021-07-17 DIAGNOSIS — R140 Abdominal distension (gaseous): Secondary | ICD-10-CM

## 2021-07-17 DIAGNOSIS — Z7984 Long term (current) use of oral hypoglycemic drugs: Secondary | ICD-10-CM | POA: Diagnosis not present

## 2021-07-17 DIAGNOSIS — Z86010 Personal history of colonic polyps: Secondary | ICD-10-CM | POA: Diagnosis not present

## 2021-07-17 DIAGNOSIS — D122 Benign neoplasm of ascending colon: Secondary | ICD-10-CM | POA: Diagnosis not present

## 2021-07-17 DIAGNOSIS — I10 Essential (primary) hypertension: Secondary | ICD-10-CM | POA: Diagnosis not present

## 2021-07-17 DIAGNOSIS — K219 Gastro-esophageal reflux disease without esophagitis: Secondary | ICD-10-CM | POA: Diagnosis not present

## 2021-07-17 DIAGNOSIS — E119 Type 2 diabetes mellitus without complications: Secondary | ICD-10-CM | POA: Insufficient documentation

## 2021-07-17 DIAGNOSIS — Z79899 Other long term (current) drug therapy: Secondary | ICD-10-CM | POA: Insufficient documentation

## 2021-07-17 DIAGNOSIS — K635 Polyp of colon: Secondary | ICD-10-CM | POA: Diagnosis not present

## 2021-07-17 DIAGNOSIS — R143 Flatulence: Secondary | ICD-10-CM

## 2021-07-17 DIAGNOSIS — E7800 Pure hypercholesterolemia, unspecified: Secondary | ICD-10-CM

## 2021-07-17 DIAGNOSIS — Z87891 Personal history of nicotine dependence: Secondary | ICD-10-CM | POA: Diagnosis not present

## 2021-07-17 DIAGNOSIS — Z01419 Encounter for gynecological examination (general) (routine) without abnormal findings: Secondary | ICD-10-CM

## 2021-07-17 DIAGNOSIS — K30 Functional dyspepsia: Secondary | ICD-10-CM

## 2021-07-17 HISTORY — PX: POLYPECTOMY: SHX5525

## 2021-07-17 HISTORY — PX: COLONOSCOPY WITH PROPOFOL: SHX5780

## 2021-07-17 LAB — GLUCOSE, CAPILLARY: Glucose-Capillary: 104 mg/dL — ABNORMAL HIGH (ref 70–99)

## 2021-07-17 SURGERY — COLONOSCOPY WITH PROPOFOL
Anesthesia: General

## 2021-07-17 MED ORDER — LIDOCAINE HCL (CARDIAC) PF 100 MG/5ML IV SOSY
PREFILLED_SYRINGE | INTRAVENOUS | Status: DC | PRN
  Administered 2021-07-17: 50 mg via INTRAVENOUS

## 2021-07-17 MED ORDER — EPHEDRINE 5 MG/ML INJ
INTRAVENOUS | Status: AC
  Filled 2021-07-17: qty 5

## 2021-07-17 MED ORDER — PROPOFOL 10 MG/ML IV BOLUS
INTRAVENOUS | Status: DC | PRN
  Administered 2021-07-17: 100 mg via INTRAVENOUS

## 2021-07-17 MED ORDER — EPHEDRINE SULFATE-NACL 50-0.9 MG/10ML-% IV SOSY
PREFILLED_SYRINGE | INTRAVENOUS | Status: DC | PRN
  Administered 2021-07-17: 5 mg via INTRAVENOUS

## 2021-07-17 MED ORDER — PHENYLEPHRINE 80 MCG/ML (10ML) SYRINGE FOR IV PUSH (FOR BLOOD PRESSURE SUPPORT)
PREFILLED_SYRINGE | INTRAVENOUS | Status: DC | PRN
  Administered 2021-07-17 (×3): 80 ug via INTRAVENOUS

## 2021-07-17 MED ORDER — PROPOFOL 500 MG/50ML IV EMUL
INTRAVENOUS | Status: DC | PRN
  Administered 2021-07-17: 150 ug/kg/min via INTRAVENOUS

## 2021-07-17 MED ORDER — LACTATED RINGERS IV SOLN
INTRAVENOUS | Status: DC
  Administered 2021-07-17: 1000 mL via INTRAVENOUS

## 2021-07-17 NOTE — Anesthesia Preprocedure Evaluation (Signed)
Anesthesia Evaluation  Patient identified by MRN, date of birth, ID band Patient awake    Reviewed: Allergy & Precautions, NPO status , Patient's Chart, lab work & pertinent test results  Airway Mallampati: II  TM Distance: >3 FB Neck ROM: Full    Dental  (+) Missing, Dental Advisory Given   Pulmonary neg pulmonary ROS, former smoker,           Cardiovascular Exercise Tolerance: Good hypertension, Pt. on medications Normal cardiovascular exam Rhythm:Regular Rate:Normal     Neuro/Psych negative neurological ROS  negative psych ROS   GI/Hepatic Neg liver ROS, GERD  Controlled,  Endo/Other  diabetes, Well Controlled, Type 2, Oral Hypoglycemic Agents  Renal/GU negative Renal ROS  negative genitourinary   Musculoskeletal negative musculoskeletal ROS (+)   Abdominal   Peds negative pediatric ROS (+)  Hematology negative hematology ROS (+)   Anesthesia Other Findings   Reproductive/Obstetrics negative OB ROS                             Anesthesia Physical Anesthesia Plan  ASA: 2  Anesthesia Plan: General   Post-op Pain Management: Minimal or no pain anticipated   Induction: Intravenous  PONV Risk Score and Plan: Propofol infusion  Airway Management Planned: Nasal Cannula and Natural Airway  Additional Equipment:   Intra-op Plan:   Post-operative Plan:   Informed Consent: I have reviewed the patients History and Physical, chart, labs and discussed the procedure including the risks, benefits and alternatives for the proposed anesthesia with the patient or authorized representative who has indicated his/her understanding and acceptance.     Dental advisory given  Plan Discussed with: CRNA and Surgeon  Anesthesia Plan Comments:         Anesthesia Quick Evaluation

## 2021-07-17 NOTE — Discharge Instructions (Signed)
  Colonoscopy Discharge Instructions  Read the instructions outlined below and refer to this sheet in the next few weeks. These discharge instructions provide you with general information on caring for yourself after you leave the hospital. Your doctor may also give you specific instructions. While your treatment has been planned according to the most current medical practices available, unavoidable complications occasionally occur. If you have any problems or questions after discharge, call Dr. Gala Romney at [phone]. ACTIVITY You may resume your regular activity, but move at a slower pace for the next 24 hours.  Take frequent rest periods for the next 24 hours.  Walking will help get rid of the air and reduce the bloated feeling in your belly (abdomen).  No driving for 24 hours (because of the medicine (anesthesia) used during the test).   Do not sign any important legal documents or operate any machinery for 24 hours (because of the anesthesia used during the test).  NUTRITION Drink plenty of fluids.  You may resume your normal diet as instructed by your doctor.  Begin with a light meal and progress to your normal diet. Heavy or fried foods are harder to digest and may make you feel sick to your stomach (nauseated).  Avoid alcoholic beverages for 24 hours or as instructed.  MEDICATIONS You may resume your normal medications unless your doctor tells you otherwise.  WHAT YOU CAN EXPECT TODAY Some feelings of bloating in the abdomen.  Passage of more gas than usual.  Spotting of blood in your stool or on the toilet paper.  IF YOU HAD POLYPS REMOVED DURING THE COLONOSCOPY: No aspirin products for 7 days or as instructed.  No alcohol for 7 days or as instructed.  Eat a soft diet for the next 24 hours.  FINDING OUT THE RESULTS OF YOUR TEST Not all test results are available during your visit. If your test results are not back during the visit, make an appointment with your caregiver to find out the  results. Do not assume everything is normal if you have not heard from your caregiver or the medical facility. It is important for you to follow up on all of your test results.  SEEK IMMEDIATE MEDICAL ATTENTION IF: You have more than a spotting of blood in your stool.  Your belly is swollen (abdominal distention).  You are nauseated or vomiting.  You have a temperature over 101.  You have abdominal pain or discomfort that is severe or gets worse throughout the day.      3 small polyps removed your colon today   further recommendations to follow pending review of pathology report   at patient request I called Cassandra Ballard at [phone] -  reviewed findings and recommendations

## 2021-07-17 NOTE — Op Note (Signed)
[facility] Patient Name: Cassandra Ballard Procedure Date: 07/17/2021 9:08 AM MRN: [id number] Date of Birth: 1960/09/12 Attending MD: Norvel Richards , MD CSN: [id number] Age: 61 Admit Type: Outpatient Procedure:                Colonoscopy Indications:              High risk colon cancer surveillance: Personal                            history of colonic polyps Providers:                Norvel Richards, MD, Lurline Del, RN, Casimer Bilis, Technician Referring MD:              Medicines:                Propofol per Anesthesia Complications:            No immediate complications. Estimated Blood Loss:     Estimated blood loss was minimal. Procedure:                Pre-Anesthesia Assessment:                           - Prior to the procedure, a History and Physical                            was performed, and patient medications and                            allergies were reviewed. The patient's tolerance of                            previous anesthesia was also reviewed. The risks                            and benefits of the procedure and the sedation                            options and risks were discussed with the patient.                            All questions were answered, and informed consent                            was obtained. Prior Anticoagulants: The patient has                            taken no previous anticoagulant or antiplatelet                            agents. ASA Grade Assessment: II - A patient with  mild systemic disease. After reviewing the risks                            and benefits, the patient was deemed in                            satisfactory condition to undergo the procedure.                           After obtaining informed consent, the colonoscope                            was passed under direct vision. Throughout the                            procedure, the  patient's blood pressure, pulse, and                            oxygen saturations were monitored continuously. The                            [id number]) scope was introduced through the                            anus and advanced to the the cecum, identified by                            appendiceal orifice and ileocecal valve. The                            colonoscopy was performed without difficulty. The                            patient tolerated the procedure well. The quality                            of the bowel preparation was adequate. Scope In: 9:41:04 AM Scope Out: 9:56:25 AM Scope Withdrawal Time: 0 hours 9 minutes 21 seconds  Total Procedure Duration: 0 hours 15 minutes 21 seconds  Findings:      The perianal and digital rectal examinations were normal.      Three sessile polyps were found in the recto-sigmoid colon and ascending       colon. The polyps were 5 to 7 mm in size. These polyps were removed with       a cold snare. Resection and retrieval were complete. Estimated blood       loss: none.      The exam was otherwise without abnormality on direct and retroflexion       views. Impression:               - Three 5 to 7 mm polyps at the recto-sigmoid colon                            and in the ascending colon, removed with a cold  snare. Resected and retrieved.                           - The examination was otherwise normal on direct                            and retroflexion views. Moderate Sedation:      Moderate (conscious) sedation was personally administered by an       anesthesia professional. The following parameters were monitored: oxygen       saturation, heart rate, blood pressure, respiratory rate, EKG, adequacy       of pulmonary ventilation, and response to care. Recommendation:           - Patient has a contact number available for                            emergencies. The signs and symptoms of potential                             delayed complications were discussed with the                            patient. Return to normal activities tomorrow.                            Written discharge instructions were provided to the                            patient.                           - Advance diet as tolerated.                           - Continue present medications.                           - Repeat colonoscopy date to be determined after                            pending pathology results are reviewed for                            surveillance.                           - Return to GI office (date not yet determined). Procedure Code(s):        --- Professional ---                           [phone], Colonoscopy, flexible; with removal of                            tumor(s), polyp(s), or other lesion(s) by snare  technique Diagnosis Code(s):        --- Professional ---                           Z86.010, Personal history of colonic polyps                           K63.5, Polyp of colon CPT copyright 2019 [employer]. All rights reserved. The codes documented in this report are preliminary and upon coder review may  be revised to meet current compliance requirements. Cristopher Estimable. Rosabell Geyer, MD Norvel Richards, MD 07/17/2021 10:02:12 AM This report has been signed electronically. Number of Addenda: 0

## 2021-07-17 NOTE — Anesthesia Procedure Notes (Signed)
Date/Time: 07/17/2021 9:42 AM Performed by: Orlie Dakin, CRNA Pre-anesthesia Checklist: Patient identified, Emergency Drugs available, Suction available and Patient being monitored Oxygen Delivery Method: Nasal cannula Induction Type: IV induction Placement Confirmation: positive ETCO2

## 2021-07-17 NOTE — Transfer of Care (Signed)
Immediate Anesthesia Transfer of Care Note  Patient: Cassandra Ballard  Procedure(s) Performed: COLONOSCOPY WITH PROPOFOL POLYPECTOMY  Patient Location: Endoscopy Unit  Anesthesia Type:General  Level of Consciousness: awake  Airway & Oxygen Therapy: Patient Spontanous Breathing  Post-op Assessment: Report given to RN and Post -op Vital signs reviewed and stable  Post vital signs: Reviewed and stable  Last Vitals:  Vitals Value Taken Time  BP    Temp    Pulse    Resp    SpO2      Last Pain:  Vitals:   07/17/21 0937  TempSrc:   PainSc: 0-No pain      Patients Stated Pain Goal: 7 (05/39/76 7341)  Complications: No notable events documented.

## 2021-07-17 NOTE — H&P (Signed)
$'@LOGO'k$ @   Primary Care Physician:  Carrolyn Meiers, MD Primary Gastroenterologist:  Dr. Gala Romney  Pre-Procedure History & Physical: HPI:  Cassandra Ballard is a 61 y.o. female here for Surveillance colonoscopy.  History of colonic adenoma removed from her colon 2012.  No GI symptoms at this time.  Here for surveillance examination.  Past Medical History:  Diagnosis Date   Diabetes mellitus without complication ([place])    no medications   History of seasonal allergies    Hypercholesteremia    Hypertension    norvasc    Past Surgical History:  Procedure Laterality Date   COLONOSCOPY  02/05/2011   RMR:4 mm polyp in the ascending/segment (tubular adenoma) ; otherwise normal colon. multiple diminutive hyperplastic. next TCS 01/2018   DILATION AND CURETTAGE OF UTERUS     right hand sugery     TUBAL LIGATION      Prior to Admission medications   Medication Sig Start Date End Date Taking? Authorizing Provider  atorvastatin (LIPITOR) 10 MG tablet Take 10 mg by mouth daily. 06/05/21  Yes [provider]  cetirizine (ZYRTEC) 10 MG tablet Take 10 mg by mouth daily.   Yes [provider]  clotrimazole-betamethasone (LOTRISONE) cream Apply 1 application. topically 2 (two) times daily as needed (eczema). 05/06/21  Yes [provider]  fluticasone (FLONASE) 50 MCG/ACT nasal spray Place 2 sprays into the nose daily as needed for allergies.    Yes [provider]  lisinopril (ZESTRIL) 5 MG tablet Take 5 mg by mouth daily. 06/18/21  Yes [provider]  metFORMIN (GLUCOPHAGE) 500 MG tablet Take 500 mg by mouth 2 (two) times daily with a meal. 05/06/21  Yes [provider]  Multiple Vitamins-Minerals (MULTIVITAMIN ADULT) CHEW Chew 2 each by mouth daily. Women's Olly Chewable   Yes [provider]  ibuprofen (ADVIL,MOTRIN) 200 MG tablet Take 400 mg by mouth every 8 (eight) hours as needed for mild pain.    [provider]     Allergies as of 06/19/2021   (No Known Allergies)    Family History  Problem Relation Age of Onset   Colon cancer Neg Hx    Lung cancer Mother    Hypertension Mother    Lung cancer Father    Hypertension Father    Hypertension Sister    Hypertension Brother    Hypertension Sister    Hypertension Brother    Hypertension Brother    Stroke Brother     Social History   Socioeconomic History   Marital status: Married    Spouse name: Not on file   Number of children: 2   Years of education: Not on file   Highest education level: Not on file  Occupational History    Employer: UNEMPLOYED  Tobacco Use   Smoking status: Former    Packs/day: 1.00    Years: 23.00    Pack years: 23.00    Types: Cigarettes   Smokeless tobacco: Never   Tobacco comments:    quit 2002  Substance and Sexual Activity   Alcohol use: No   Drug use: No   Sexual activity: Yes    Birth control/protection: Surgical  Other Topics Concern   Not on file  Social History Narrative   Not on file   Social Determinants of Health   Financial Resource Strain: Not on file  Food Insecurity: Not on file  Transportation Needs: Not on file  Physical Activity: Not on file  Stress: Not on file  Social Connections: Not on file  Intimate Partner Violence: Not on file    Review of Systems: See HPI, otherwise negative ROS  Physical Exam: BP 116/60   Pulse 77   Temp 97.8 F (36.6 C) (Oral)   Resp 16   Ht '5\' 5"'$  (1.651 m)   Wt 103.4 kg   SpO2 98%   BMI 37.94 kg/m  General:   Alert,  Well-developed, well-nourished, pleasant and cooperative in NAD Mouth:  No deformity or lesions. Neck:  Supple; no masses or thyromegaly. No significant cervical adenopathy. Lungs:  Clear throughout to auscultation.   No wheezes, crackles, or rhonchi. No acute distress. Heart:  Regular rate and rhythm; no murmurs, clicks, rubs,  or gallops. Abdomen: Non-distended, normal bowel sounds.  Soft and nontender without  appreciable mass or hepatosplenomegaly.  Pulses:  Normal pulses noted. Extremities:  Without clubbing or edema.  Impression/Plan:  61 year old lady with a distant history of colonic adenoma here for surveillance colonoscopy.  Currently, no lower GI tract symptoms.    I have offered the patient a surveillance colonoscopy.  The risks, benefits, limitations, alternatives and imponderables have been reviewed with the patient. Questions have been answered. All parties are agreeable.       Notice: This dictation was prepared with Dragon dictation along with smaller phrase technology. Any transcriptional errors that result from this process are unintentional and may not be corrected upon review.

## 2021-07-17 NOTE — Anesthesia Postprocedure Evaluation (Signed)
Anesthesia Post Note  Patient: Cassandra Ballard  Procedure(s) Performed: COLONOSCOPY WITH PROPOFOL POLYPECTOMY  Patient location during evaluation: Phase II Anesthesia Type: General Level of consciousness: awake and alert and oriented Pain management: pain level controlled Vital Signs Assessment: post-procedure vital signs reviewed and stable Respiratory status: spontaneous breathing, nonlabored ventilation and respiratory function stable Cardiovascular status: blood pressure returned to baseline and stable Postop Assessment: no apparent nausea or vomiting Anesthetic complications: no   No notable events documented.   Last Vitals:  Vitals:   07/17/21 1006 07/17/21 1017  BP: (!) 94/53 98/64  Pulse: 82 80  Resp: 19   Temp:    SpO2: 100% 100%    Last Pain:  Vitals:   07/17/21 1017  TempSrc:   PainSc: 0-No pain                 Maridee Slape C Rudolpho Claxton

## 2021-07-18 ENCOUNTER — Encounter: Payer: Self-pay | Admitting: Internal Medicine

## 2021-07-18 LAB — SURGICAL PATHOLOGY

## 2021-07-19 ENCOUNTER — Encounter: Payer: Self-pay | Admitting: Internal Medicine

## 2021-07-19 DIAGNOSIS — K219 Gastro-esophageal reflux disease without esophagitis: Secondary | ICD-10-CM | POA: Diagnosis not present

## 2021-07-19 DIAGNOSIS — E119 Type 2 diabetes mellitus without complications: Secondary | ICD-10-CM | POA: Diagnosis not present

## 2021-07-19 DIAGNOSIS — I10 Essential (primary) hypertension: Secondary | ICD-10-CM | POA: Diagnosis not present

## 2021-07-24 ENCOUNTER — Encounter (HOSPITAL_COMMUNITY): Payer: Self-pay | Admitting: Internal Medicine

## 2021-09-09 ENCOUNTER — Other Ambulatory Visit (HOSPITAL_COMMUNITY): Payer: Self-pay | Admitting: Internal Medicine

## 2021-09-09 DIAGNOSIS — Z1231 Encounter for screening mammogram for malignant neoplasm of breast: Secondary | ICD-10-CM

## 2021-09-27 DIAGNOSIS — E119 Type 2 diabetes mellitus without complications: Secondary | ICD-10-CM | POA: Diagnosis not present

## 2021-09-27 DIAGNOSIS — Z1159 Encounter for screening for other viral diseases: Secondary | ICD-10-CM | POA: Diagnosis not present

## 2021-09-27 DIAGNOSIS — I10 Essential (primary) hypertension: Secondary | ICD-10-CM | POA: Diagnosis not present

## 2021-10-04 DIAGNOSIS — K219 Gastro-esophageal reflux disease without esophagitis: Secondary | ICD-10-CM | POA: Diagnosis not present

## 2021-10-04 DIAGNOSIS — I10 Essential (primary) hypertension: Secondary | ICD-10-CM | POA: Diagnosis not present

## 2021-10-04 DIAGNOSIS — E119 Type 2 diabetes mellitus without complications: Secondary | ICD-10-CM | POA: Diagnosis not present

## 2021-10-04 DIAGNOSIS — Z23 Encounter for immunization: Secondary | ICD-10-CM | POA: Diagnosis not present

## 2021-11-20 ENCOUNTER — Ambulatory Visit (HOSPITAL_COMMUNITY)
Admission: RE | Admit: 2021-11-20 | Discharge: 2021-11-20 | Disposition: A | Discharge status: Home / Self Care | Payer: BC Managed Care – PPO | Source: Ambulatory Visit | Attending: Internal Medicine | Admitting: Internal Medicine

## 2021-11-20 DIAGNOSIS — Z1231 Encounter for screening mammogram for malignant neoplasm of breast: Secondary | ICD-10-CM | POA: Diagnosis not present

## 2022-04-10 DIAGNOSIS — E1165 Type 2 diabetes mellitus with hyperglycemia: Secondary | ICD-10-CM | POA: Diagnosis not present

## 2022-04-10 DIAGNOSIS — J45909 Unspecified asthma, uncomplicated: Secondary | ICD-10-CM | POA: Diagnosis not present

## 2022-04-10 DIAGNOSIS — I10 Essential (primary) hypertension: Secondary | ICD-10-CM | POA: Diagnosis not present

## 2022-07-08 ENCOUNTER — Ambulatory Visit (HOSPITAL_COMMUNITY)
Admission: RE | Admit: 2022-07-08 | Discharge: 2022-07-08 | Disposition: A | Discharge status: Home / Self Care | Payer: BC Managed Care – PPO | Source: Ambulatory Visit | Attending: Gerontology | Admitting: Gerontology

## 2022-07-08 ENCOUNTER — Other Ambulatory Visit (HOSPITAL_COMMUNITY): Payer: Self-pay | Admitting: Gerontology

## 2022-07-08 DIAGNOSIS — R059 Cough, unspecified: Secondary | ICD-10-CM

## 2022-07-08 DIAGNOSIS — J4541 Moderate persistent asthma with (acute) exacerbation: Secondary | ICD-10-CM | POA: Diagnosis not present

## 2022-08-21 DIAGNOSIS — H9202 Otalgia, left ear: Secondary | ICD-10-CM | POA: Diagnosis not present

## 2022-08-21 DIAGNOSIS — J018 Other acute sinusitis: Secondary | ICD-10-CM | POA: Diagnosis not present

## 2022-08-21 DIAGNOSIS — I10 Essential (primary) hypertension: Secondary | ICD-10-CM | POA: Diagnosis not present

## 2022-09-18 DIAGNOSIS — H9202 Otalgia, left ear: Secondary | ICD-10-CM | POA: Diagnosis not present

## 2022-09-18 DIAGNOSIS — J309 Allergic rhinitis, unspecified: Secondary | ICD-10-CM | POA: Diagnosis not present

## 2022-09-18 DIAGNOSIS — J343 Hypertrophy of nasal turbinates: Secondary | ICD-10-CM | POA: Diagnosis not present

## 2022-09-23 ENCOUNTER — Other Ambulatory Visit (HOSPITAL_COMMUNITY): Payer: Self-pay | Admitting: Internal Medicine

## 2022-09-23 DIAGNOSIS — Z1231 Encounter for screening mammogram for malignant neoplasm of breast: Secondary | ICD-10-CM

## 2022-11-24 ENCOUNTER — Ambulatory Visit (HOSPITAL_COMMUNITY)
Admission: RE | Admit: 2022-11-24 | Discharge: 2022-11-24 | Disposition: A | Discharge status: Home / Self Care | Payer: BC Managed Care – PPO | Source: Ambulatory Visit | Attending: Internal Medicine | Admitting: Internal Medicine

## 2022-11-24 ENCOUNTER — Encounter (HOSPITAL_COMMUNITY): Payer: Self-pay

## 2022-11-24 DIAGNOSIS — Z1231 Encounter for screening mammogram for malignant neoplasm of breast: Secondary | ICD-10-CM | POA: Diagnosis not present

## 2023-03-17 DIAGNOSIS — K219 Gastro-esophageal reflux disease without esophagitis: Secondary | ICD-10-CM | POA: Diagnosis not present

## 2023-03-17 DIAGNOSIS — I10 Essential (primary) hypertension: Secondary | ICD-10-CM | POA: Diagnosis not present

## 2023-03-17 DIAGNOSIS — R059 Cough, unspecified: Secondary | ICD-10-CM | POA: Diagnosis not present

## 2023-03-17 DIAGNOSIS — E119 Type 2 diabetes mellitus without complications: Secondary | ICD-10-CM | POA: Diagnosis not present

## 2023-03-17 DIAGNOSIS — J4540 Moderate persistent asthma, uncomplicated: Secondary | ICD-10-CM | POA: Diagnosis not present

## 2023-03-23 DIAGNOSIS — Z0001 Encounter for general adult medical examination with abnormal findings: Secondary | ICD-10-CM | POA: Diagnosis not present

## 2023-03-23 DIAGNOSIS — E1159 Type 2 diabetes mellitus with other circulatory complications: Secondary | ICD-10-CM | POA: Diagnosis not present

## 2023-03-23 DIAGNOSIS — E119 Type 2 diabetes mellitus without complications: Secondary | ICD-10-CM | POA: Diagnosis not present

## 2023-03-23 DIAGNOSIS — J449 Chronic obstructive pulmonary disease, unspecified: Secondary | ICD-10-CM | POA: Diagnosis not present

## 2023-03-23 DIAGNOSIS — I10 Essential (primary) hypertension: Secondary | ICD-10-CM | POA: Diagnosis not present

## 2023-03-24 ENCOUNTER — Ambulatory Visit (HOSPITAL_COMMUNITY)
Admission: RE | Admit: 2023-03-24 | Discharge: 2023-03-24 | Disposition: A | Discharge status: Home / Self Care | Payer: Medicare Other | Source: Ambulatory Visit | Attending: Gerontology | Admitting: Gerontology

## 2023-03-24 ENCOUNTER — Other Ambulatory Visit (HOSPITAL_COMMUNITY): Payer: Self-pay | Admitting: Gerontology

## 2023-03-24 DIAGNOSIS — R059 Cough, unspecified: Secondary | ICD-10-CM | POA: Insufficient documentation

## 2023-03-24 DIAGNOSIS — R0989 Other specified symptoms and signs involving the circulatory and respiratory systems: Secondary | ICD-10-CM | POA: Diagnosis not present

## 2023-04-06 DIAGNOSIS — L818 Other specified disorders of pigmentation: Secondary | ICD-10-CM | POA: Diagnosis not present

## 2023-06-01 DIAGNOSIS — L818 Other specified disorders of pigmentation: Secondary | ICD-10-CM | POA: Diagnosis not present

## 2023-06-09 DIAGNOSIS — I10 Essential (primary) hypertension: Secondary | ICD-10-CM | POA: Diagnosis not present

## 2023-06-09 DIAGNOSIS — E119 Type 2 diabetes mellitus without complications: Secondary | ICD-10-CM | POA: Diagnosis not present

## 2023-06-09 DIAGNOSIS — J4540 Moderate persistent asthma, uncomplicated: Secondary | ICD-10-CM | POA: Diagnosis not present

## 2023-06-09 DIAGNOSIS — K219 Gastro-esophageal reflux disease without esophagitis: Secondary | ICD-10-CM | POA: Diagnosis not present

## 2023-07-09 DIAGNOSIS — I10 Essential (primary) hypertension: Secondary | ICD-10-CM | POA: Diagnosis not present

## 2023-07-09 DIAGNOSIS — E119 Type 2 diabetes mellitus without complications: Secondary | ICD-10-CM | POA: Diagnosis not present

## 2023-08-09 DIAGNOSIS — I10 Essential (primary) hypertension: Secondary | ICD-10-CM | POA: Diagnosis not present

## 2023-08-09 DIAGNOSIS — E119 Type 2 diabetes mellitus without complications: Secondary | ICD-10-CM | POA: Diagnosis not present

## 2023-09-08 DIAGNOSIS — I10 Essential (primary) hypertension: Secondary | ICD-10-CM | POA: Diagnosis not present

## 2023-09-08 DIAGNOSIS — E119 Type 2 diabetes mellitus without complications: Secondary | ICD-10-CM | POA: Diagnosis not present

## 2023-10-09 DIAGNOSIS — I10 Essential (primary) hypertension: Secondary | ICD-10-CM | POA: Diagnosis not present

## 2023-10-09 DIAGNOSIS — E119 Type 2 diabetes mellitus without complications: Secondary | ICD-10-CM | POA: Diagnosis not present

## 2023-11-09 DIAGNOSIS — E119 Type 2 diabetes mellitus without complications: Secondary | ICD-10-CM | POA: Diagnosis not present

## 2023-11-09 DIAGNOSIS — I10 Essential (primary) hypertension: Secondary | ICD-10-CM | POA: Diagnosis not present

## 2023-11-17 ENCOUNTER — Other Ambulatory Visit (HOSPITAL_COMMUNITY): Payer: Self-pay | Admitting: Internal Medicine

## 2023-11-17 DIAGNOSIS — Z1231 Encounter for screening mammogram for malignant neoplasm of breast: Secondary | ICD-10-CM

## 2023-11-30 ENCOUNTER — Ambulatory Visit (HOSPITAL_COMMUNITY)
Admission: RE | Admit: 2023-11-30 | Discharge: 2023-11-30 | Disposition: A | Discharge status: Home / Self Care | Source: Ambulatory Visit | Attending: Internal Medicine | Admitting: Internal Medicine

## 2023-11-30 ENCOUNTER — Ambulatory Visit (HOSPITAL_COMMUNITY)

## 2023-11-30 DIAGNOSIS — Z1231 Encounter for screening mammogram for malignant neoplasm of breast: Secondary | ICD-10-CM | POA: Insufficient documentation

## 2023-12-03 ENCOUNTER — Other Ambulatory Visit (HOSPITAL_COMMUNITY): Payer: Self-pay | Admitting: Internal Medicine

## 2023-12-03 DIAGNOSIS — R928 Other abnormal and inconclusive findings on diagnostic imaging of breast: Secondary | ICD-10-CM

## 2023-12-07 DIAGNOSIS — J4540 Moderate persistent asthma, uncomplicated: Secondary | ICD-10-CM | POA: Diagnosis not present

## 2023-12-07 DIAGNOSIS — E119 Type 2 diabetes mellitus without complications: Secondary | ICD-10-CM | POA: Diagnosis not present

## 2023-12-07 DIAGNOSIS — I10 Essential (primary) hypertension: Secondary | ICD-10-CM | POA: Diagnosis not present

## 2023-12-07 DIAGNOSIS — K219 Gastro-esophageal reflux disease without esophagitis: Secondary | ICD-10-CM | POA: Diagnosis not present

## 2023-12-10 ENCOUNTER — Ambulatory Visit (HOSPITAL_COMMUNITY)
Admission: RE | Admit: 2023-12-10 | Discharge: 2023-12-10 | Disposition: A | Discharge status: Home / Self Care | Source: Ambulatory Visit | Attending: Internal Medicine | Admitting: Internal Medicine

## 2023-12-10 ENCOUNTER — Encounter (HOSPITAL_COMMUNITY): Payer: Self-pay

## 2023-12-10 DIAGNOSIS — R928 Other abnormal and inconclusive findings on diagnostic imaging of breast: Secondary | ICD-10-CM

## 2023-12-11 ENCOUNTER — Other Ambulatory Visit (HOSPITAL_COMMUNITY): Payer: Self-pay | Admitting: Internal Medicine

## 2023-12-11 DIAGNOSIS — R928 Other abnormal and inconclusive findings on diagnostic imaging of breast: Secondary | ICD-10-CM

## 2023-12-24 ENCOUNTER — Inpatient Hospital Stay (HOSPITAL_COMMUNITY): Admission: RE | Admit: 2023-12-24 | Discharge: 2023-12-24 | Attending: Internal Medicine | Admitting: Internal Medicine

## 2023-12-24 ENCOUNTER — Encounter (HOSPITAL_COMMUNITY): Payer: Self-pay

## 2023-12-24 ENCOUNTER — Ambulatory Visit (HOSPITAL_COMMUNITY)
Admission: RE | Admit: 2023-12-24 | Discharge: 2023-12-24 | Disposition: A | Discharge status: Home / Self Care | Source: Ambulatory Visit | Attending: Internal Medicine | Admitting: Internal Medicine

## 2023-12-24 DIAGNOSIS — R928 Other abnormal and inconclusive findings on diagnostic imaging of breast: Secondary | ICD-10-CM

## 2023-12-24 HISTORY — PX: BREAST BIOPSY: SHX20

## 2023-12-24 MED ORDER — LIDOCAINE-EPINEPHRINE (PF) 1 %-1:200000 IJ SOLN
10.0000 mL | Freq: Once | INTRAMUSCULAR | Status: AC
  Administered 2023-12-24: 10 mL via INTRADERMAL

## 2023-12-24 MED ORDER — LIDOCAINE HCL (PF) 2 % IJ SOLN
10.0000 mL | Freq: Once | INTRAMUSCULAR | Status: AC
  Administered 2023-12-24: 10 mL

## 2023-12-24 NOTE — Progress Notes (Signed)
 PT tolerated right breast biopsy well today with NAD noted. PT verbalized understanding of discharge instructions. PT ambulated back to the mammogram area this time and given ice packs. Specimens taken to lab for processing by Colette from ultrasound.

## 2023-12-28 LAB — SURGICAL PATHOLOGY
